# Patient Record
Sex: Female | Born: 1945 | Race: White | Hispanic: No | Smoking: Never smoker
Health system: Southern US, Community
[De-identification: ages and names within clinical notes are randomized; demographics above are authoritative.]

## PROBLEM LIST (undated history)

## (undated) DIAGNOSIS — E785 Hyperlipidemia, unspecified: Secondary | ICD-10-CM

## (undated) DIAGNOSIS — N390 Urinary tract infection, site not specified: Secondary | ICD-10-CM

## (undated) HISTORY — DX: Hyperlipidemia, unspecified: E78.5

---

## 1978-05-10 HISTORY — PX: OTHER SURGICAL HISTORY: SHX169

## 1978-05-10 HISTORY — PX: ABDOMINAL HYSTERECTOMY: SHX81

## 1978-05-10 HISTORY — PX: RECONSTRUCTION URETHROPLASTY: SHX2302

## 2018-02-15 LAB — HM COLONOSCOPY

## 2020-02-20 ENCOUNTER — Other Ambulatory Visit: Payer: Self-pay

## 2020-02-20 ENCOUNTER — Ambulatory Visit (INDEPENDENT_AMBULATORY_CARE_PROVIDER_SITE_OTHER): Payer: Medicare HMO | Admitting: Family Medicine

## 2020-02-20 ENCOUNTER — Encounter: Payer: Self-pay | Admitting: Family Medicine

## 2020-02-20 VITALS — BP 145/59 | HR 57 | Temp 99.1°F | Resp 17 | Ht 62.5 in | Wt 135.8 lb

## 2020-02-20 DIAGNOSIS — Z7689 Persons encountering health services in other specified circumstances: Secondary | ICD-10-CM | POA: Insufficient documentation

## 2020-02-20 DIAGNOSIS — E781 Pure hyperglyceridemia: Secondary | ICD-10-CM | POA: Diagnosis not present

## 2020-02-20 DIAGNOSIS — Z5320 Procedure and treatment not carried out because of patient's decision for unspecified reasons: Secondary | ICD-10-CM | POA: Diagnosis not present

## 2020-02-20 DIAGNOSIS — J302 Other seasonal allergic rhinitis: Secondary | ICD-10-CM | POA: Insufficient documentation

## 2020-02-20 DIAGNOSIS — Z13220 Encounter for screening for lipoid disorders: Secondary | ICD-10-CM

## 2020-02-20 DIAGNOSIS — L989 Disorder of the skin and subcutaneous tissue, unspecified: Secondary | ICD-10-CM | POA: Diagnosis not present

## 2020-02-20 DIAGNOSIS — Z1329 Encounter for screening for other suspected endocrine disorder: Secondary | ICD-10-CM | POA: Diagnosis not present

## 2020-02-20 NOTE — Progress Notes (Signed)
Subjective:    Patient ID: April Russell, female    DOB: [birth year], 74 y.o.   MRN: [id number]  April Russell is a 73 y.o. female presenting on 02/20/2020 for [facility] (recently relocated [place] after her husband passed away. Skin tag on the left side of her cheek x  2 yrs. He  complains of itching . Pt concern because her sister was diagnose with skin cancer.)   HPI  April Russell presents to clinic as new patient for primary care establishment.  Previous PCP was > 2 years ago and is unsure who she had previously met with.  Records will not be requested.  Past medical, family, and surgical history reviewed w/ pt.  She has acute concerns today for skin tag on the left cheek x 2 years.  States has some itching there.  Has some concerns because her sister was recently diagnosed with skin cancer.   Depression screen PHQ 2/9 02/20/2020  Decreased Interest 0  Down, Depressed, Hopeless 3  PHQ - 2 Score 3  Altered sleeping 2  Tired, decreased energy 0  Change in appetite 1  Feeling bad or failure about yourself  0  Trouble concentrating 0  Moving slowly or fidgety/restless 0  Suicidal thoughts 0  PHQ-9 Score 6  Difficult doing work/chores Not difficult at all    Social History   Tobacco Use  . Smoking status: Never Smoker  . Smokeless tobacco: Never Used  Vaping Use  . Vaping Use: Never used  Substance Use Topics  . Alcohol use: Yes    Alcohol/week: 4.0 standard drinks    Types: 4 Glasses of wine per week  . Drug use: Never    Review of Systems  Constitutional: Negative.   HENT: Negative.   Eyes: Negative.   Respiratory: Negative.   Cardiovascular: Negative.   Gastrointestinal: Negative.   Endocrine: Negative.   Genitourinary: Negative.   Musculoskeletal: Negative.   Skin: Negative.        Skin lesion left cheek  Allergic/Immunologic: Negative.   Neurological: Negative.   Hematological: Negative.   Psychiatric/Behavioral: Negative.    Per HPI unless  specifically indicated above     Objective:    BP (!) 145/59 (BP Location: Left Arm, Patient Position: Sitting, Cuff Size: Normal)   Pulse (!) 57   Temp 99.1 F (37.3 C) (Oral)   Resp 17   Ht 5' 2.5" (1.588 m)   Wt 135 lb 12.8 oz (61.6 kg)   SpO2 100%   BMI 24.44 kg/m   Wt Readings from Last 3 Encounters:  02/20/20 135 lb 12.8 oz (61.6 kg)    Physical Exam Vitals and nursing note reviewed.  Constitutional:      General: She is not in acute distress.    Appearance: Normal appearance. She is well-developed and well-groomed. She is not ill-appearing or toxic-appearing.  HENT:     Head: Normocephalic and atraumatic.     Nose:     Comments: Lizbeth Bark is in place, covering mouth and nose. Eyes:     General: Lids are normal. Vision grossly intact.        Right eye: No discharge.        Left eye: No discharge.     Extraocular Movements: Extraocular movements intact.     Conjunctiva/sclera: Conjunctivae normal.     Pupils: Pupils are equal, round, and reactive to light.  Cardiovascular:     Rate and Rhythm: Normal rate and regular rhythm.     Pulses: Normal  pulses.          Dorsalis pedis pulses are 2+ on the right side and 2+ on the left side.     Heart sounds: Normal heart sounds. No murmur heard.  No friction rub. No gallop.   Pulmonary:     Effort: Pulmonary effort is normal. No respiratory distress.     Breath sounds: Normal breath sounds.  Musculoskeletal:     Right lower leg: No edema.     Left lower leg: No edema.  Skin:    General: Skin is warm and dry.     Capillary Refill: Capillary refill takes less than 2 seconds.  Neurological:     General: No focal deficit present.     Mental Status: She is alert and oriented to person, place, and time.  Psychiatric:        Attention and Perception: Attention and perception normal.        Mood and Affect: Mood and affect normal.        Speech: Speech normal.        Behavior: Behavior normal. Behavior is cooperative.         Thought Content: Thought content normal.        Cognition and Memory: Cognition and memory normal.        Judgment: Judgment normal.    No results found for this or any previous visit.    Assessment & Plan:   Problem List Items Addressed This Visit      Musculoskeletal and Integument   Skin lesion    Left periauricular area with small skin lesion, irregular borders, scaling on surface, approx size of tip of pencil eraser, reports is itching/flaking.  Sister had a history of skin cancer.  Has recently relocated to the area, needing to establish with a dermatology.  Plan: 1. Referral to dermatology placed.      Relevant Orders   Ambulatory referral to Dermatology     Other   Encounter to establish care with new doctor - Primary    New patient establishment at [facility] for primary care.  Is unsure who her previous PCP was, reports was > 2 years ago and in [place].  Reports has not been diagnosed with anything or taken any prescription medications in the past.  Will request medical records for review from [facility], reports is where she had her Colonoscopy in 2018.  Plan: 1. Have baseline labs drawn today 2. RTC in 4 weeks for CPE      Relevant Orders   CBC with Differential   COMPLETE METABOLIC PANEL WITH GFR   Seasonal allergies    Reports happens once a year when seasons change, typically takes a benadryl for adequate control of symptoms.      Breast screening declined    Reports last mammogram in the past 2 years, no reports or records to review.  Offered and declined screening mammo order.  Will revisit at next office visit.      Osteoporosis screening declined    Discussed need for DEXA, reports not having one in the past, is postmenopausal.  Offered and declined, will revisit at next follow up visit.       Other Visit Diagnoses    Screening for lipid disorders       Relevant Orders   Lipid Profile   Screening for thyroid disorder       Relevant Orders     TSH + free T4      No  orders of the defined types were placed in this encounter.  Follow up plan: Return in about 3 months (around 05/22/2020) for CPE.   Harlin Rain, [place] Family Nurse Practitioner [facility] 02/20/2020, 11:34 AM

## 2020-02-20 NOTE — Assessment & Plan Note (Signed)
Discussed need for DEXA, reports not having one in the past, is postmenopausal.  Offered and declined, will revisit at next follow up visit.

## 2020-02-20 NOTE — Assessment & Plan Note (Signed)
Left periauricular area with small skin lesion, irregular borders, scaling on surface, approx size of tip of pencil eraser, reports is itching/flaking.  Sister had a history of skin cancer.  Has recently relocated to the area, needing to establish with a dermatology.  Plan: 1. Referral to dermatology placed.

## 2020-02-20 NOTE — Assessment & Plan Note (Signed)
Reports happens once a year when seasons change, typically takes a benadryl for adequate control of symptoms.

## 2020-02-20 NOTE — Assessment & Plan Note (Signed)
New patient establishment at [facility] for primary care.  Is unsure who her previous PCP was, reports was > 2 years ago and in [place].  Reports has not been diagnosed with anything or taken any prescription medications in the past.  Will request medical records for review from [facility], reports is where she had her Colonoscopy in 2018.  Plan: 1. Have baseline labs drawn today 2. RTC in 4 weeks for CPE

## 2020-02-20 NOTE — Patient Instructions (Addendum)
Have your labs drawn and we will contact you with the results.  A referral to Dermatology has been placed today.  If you have not heard from the specialty office or our referral coordinator within 1 week, please let us know and we will follow up with the referral coordinator for an update.  We will plan to see you back in 3 months for your physical  You will receive a survey after today's visit either digitally by e-mail or paper by USPS mail. Your experiences and feedback matter to [place].  Please respond so we know how we are doing as we provide care for you.  Call us with any questions/concerns/needs.  It is my goal to be available to you for your health concerns.  Thanks for choosing me to be a partner in your healthcare needs!  Charlaine Dalton, FNP-C Family Nurse Practitioner [facility] Phone: [phone]

## 2020-02-20 NOTE — Assessment & Plan Note (Signed)
Reports last mammogram in the past 2 years, no reports or records to review.  Offered and declined screening mammo order.  Will revisit at next office visit.

## 2020-02-21 ENCOUNTER — Other Ambulatory Visit: Payer: Self-pay | Admitting: Family Medicine

## 2020-02-21 LAB — COMPLETE METABOLIC PANEL WITH GFR
AG Ratio: 1.7 (calc) (ref 1.0–2.5)
ALT: 13 U/L (ref 6–29)
AST: 21 U/L (ref 10–35)
Albumin: 4.3 g/dL (ref 3.6–5.1)
Alkaline phosphatase (APISO): 61 U/L (ref 37–153)
BUN: 9 mg/dL (ref 7–25)
CO2: 27 mmol/L (ref 20–32)
Calcium: 9.1 mg/dL (ref 8.6–10.4)
Chloride: 100 mmol/L (ref 98–110)
Creat: 0.91 mg/dL (ref 0.60–0.93)
GFR, Est African American: 73 mL/min/{1.73_m2} (ref >=60)
GFR, Est Non African American: 63 mL/min/{1.73_m2} (ref >=60)
Globulin: 2.6 g/dL (calc) (ref 1.9–3.7)
Glucose, Bld: 88 mg/dL (ref 65–99)
Potassium: 4.3 mmol/L (ref 3.5–5.3)
Sodium: 134 mmol/L — ABNORMAL LOW (ref 135–146)
Total Bilirubin: 0.5 mg/dL (ref 0.2–1.2)
Total Protein: 6.9 g/dL (ref 6.1–8.1)

## 2020-02-21 LAB — CBC WITH DIFFERENTIAL/PLATELET
Absolute Monocytes: 498 cells/uL (ref 200–950)
Basophils Absolute: 19 cells/uL (ref 0–200)
Basophils Relative: 0.4 %
Eosinophils Absolute: 38 cells/uL (ref 15–500)
Eosinophils Relative: 0.8 %
HCT: 40.3 % (ref 35.0–45.0)
Hemoglobin: 13.1 g/dL (ref 11.7–15.5)
Lymphs Abs: 1471 cells/uL (ref 850–3900)
MCH: 29 pg (ref 27.0–33.0)
MCHC: 32.5 g/dL (ref 32.0–36.0)
MCV: 89.4 fL (ref 80.0–100.0)
MPV: 10.7 fL (ref 7.5–12.5)
Monocytes Relative: 10.6 %
Neutro Abs: 2674 cells/uL (ref 1500–7800)
Neutrophils Relative %: 56.9 %
Platelets: 167 10*3/uL (ref 140–400)
RBC: 4.51 10*6/uL (ref 3.80–5.10)
RDW: 12.3 % (ref 11.0–15.0)
Total Lymphocyte: 31.3 %
WBC: 4.7 10*3/uL (ref 3.8–10.8)

## 2020-02-21 LAB — LIPID PANEL
Cholesterol: 186 mg/dL (ref <200)
HDL: 57 mg/dL (ref >=50)
LDL Cholesterol (Calc): 99 mg/dL (calc)
Non-HDL Cholesterol (Calc): 129 mg/dL (calc) (ref <130)
Total CHOL/HDL Ratio: 3.3 (calc) (ref <5.0)
Triglycerides: 208 mg/dL — ABNORMAL HIGH (ref <150)

## 2020-02-21 LAB — TSH+FREE T4: TSH W/REFLEX TO FT4: 1.63 mIU/L (ref 0.40–4.50)

## 2020-03-20 DIAGNOSIS — L821 Other seborrheic keratosis: Secondary | ICD-10-CM | POA: Diagnosis not present

## 2020-03-20 DIAGNOSIS — L570 Actinic keratosis: Secondary | ICD-10-CM | POA: Diagnosis not present

## 2020-05-21 ENCOUNTER — Encounter: Payer: Self-pay | Admitting: Family Medicine

## 2020-05-21 ENCOUNTER — Other Ambulatory Visit: Payer: Self-pay

## 2020-05-21 ENCOUNTER — Ambulatory Visit (INDEPENDENT_AMBULATORY_CARE_PROVIDER_SITE_OTHER): Payer: Medicare HMO | Admitting: Family Medicine

## 2020-05-21 VITALS — BP 143/62 | HR 70 | Temp 97.1°F | Resp 18 | Ht 62.5 in | Wt 139.4 lb

## 2020-05-21 DIAGNOSIS — Z13820 Encounter for screening for osteoporosis: Secondary | ICD-10-CM | POA: Insufficient documentation

## 2020-05-21 DIAGNOSIS — Z0000 Encounter for general adult medical examination without abnormal findings: Secondary | ICD-10-CM | POA: Diagnosis not present

## 2020-05-21 DIAGNOSIS — Z1231 Encounter for screening mammogram for malignant neoplasm of breast: Secondary | ICD-10-CM | POA: Diagnosis not present

## 2020-05-21 NOTE — Assessment & Plan Note (Signed)
Pt postmenopausal w/out known history of prior DEXA scan.    Plan: 1. Obtain DG bone density.

## 2020-05-21 NOTE — Assessment & Plan Note (Signed)
Pt last mammogram unknown.   Plan: 1. Screening mammogram order placed.  Pt will call to schedule appointment.  Information given.  

## 2020-05-21 NOTE — Patient Instructions (Signed)
For Mammogram screening for breast cancer and DEXA Scan (Bone mineral density) screening for osteoporosis  Call the Imaging Center below anytime to schedule your own appointment now that order has been placed.  [facility] [address] [place], [place] [address] Phone: [phone]  [facility] [address] [place], [place] [address] Phone: [phone]  Well Visit: Care Instructions Overview  Well visits can help you stay healthy. Your provider has checked your overall health and may have suggested ways to take good care of yourself. Your provider also may have recommended tests. At home, you can help prevent illness with healthy eating, regular exercise, and other steps.  Follow-up care is a key part of your treatment and safety. Be sure to make and go to all appointments, and call your provider if you are having problems. It's also a good idea to know your test results and keep a list of the medicines you take.  How can you care for yourself at home?   Get screening tests that you and your doctor decide on. Screening helps find diseases before any symptoms appear.   Eat healthy foods. Choose fruits, vegetables, whole grains, protein, and low-fat dairy foods. Limit fat, especially saturated fat. Reduce salt in your diet.   Limit alcohol. If you are a man, have no more than 2 drinks a day or 14 drinks a week. If you are a woman, have no more than 1 drink a day or 7 drinks a week.   Get at least 30 minutes of physical activity on most days of the week.  We recommend you go no more than 2 days in a row without exercise. Walking is a good choice. You also may want to do other activities, such as running, swimming, cycling, or playing tennis or team sports. Discuss any changes in your exercise program with your provider.   Reach and stay at a healthy weight. This will lower your risk for many problems, such as  obesity, diabetes, heart disease, and high blood pressure.   Do not smoke or allow others to smoke around you. If you need help quitting, talk to your provider about stop-smoking programs and medicines. These can increase your chances of quitting for good.  Can call 1-800-QUIT-NOW (9854207637) for the [facility], assistance with smoking cessation.   Care for your mental health. It is easy to get weighed down by worry and stress. Learn strategies to manage stress, like deep breathing and mindfulness, and stay connected with your family and community. If you find you often feel sad or hopeless, talk with your provider. Treatment can help.   Talk to your provider about whether you have any risk factors for sexually transmitted infections (STIs). You can help prevent STIs if you wait to have sex with a new partner (or partners) until you've each been tested for STIs. It also helps if you use condoms (female or female condoms) and if you limit your sex partners to one person who only has sex with you. Vaccines are available for some STIs, such as HPV (these are age dependent).   If you think you may have a problem with alcohol or drug use, talk to your provider. This includes prescription medicines (such as amphetamines and opioids) and illegal drugs (such as cocaine and methamphetamine). Your provider can help you figure out what type of treatment is best for you.   If you have concerns about domestic violence or intimate  partner violence, there are resources available to you. National Domestic Abuse Hotline 984-378-2045   Protect your skin from too much sun. When you're outdoors from 10 a.m. to 4 p.m., stay in the shade or cover up with clothing and a hat with a wide brim. Wear sunglasses that block UV rays. Even when it's cloudy, put broad-spectrum sunscreen (SPF 30 or higher) on any exposed skin.   See a dentist one or two times a year for checkups and to have your teeth  cleaned.   See an eye doctor once per year for an eye exam.   Wear a seat belt in the car.  When should you call for help?  Watch closely for changes in your health, and be sure to contact your provider if you have any problems or symptoms that concern you.  We will plan to see you back in 12 months for your physical  You will receive a survey after today's visit either digitally by e-mail or paper by USPS mail. Your experiences and feedback matter to [place].  Please respond so we know how we are doing as we provide care for you.  Call us with any questions/concerns/needs.  It is my goal to be available to you for your health concerns.  Thanks for choosing me to be a partner in your healthcare needs!  Charlaine Dalton, FNP-C Family Nurse Practitioner [facility] Phone: [phone]

## 2020-05-21 NOTE — Assessment & Plan Note (Signed)
Annual physical exam without new findings.  Well adult with no acute concerns.  Plan: 1. Obtain health maintenance screenings as above according to age. - Increase physical activity to 30 minutes most days of the week.  - Eat healthy diet high in vegetables and fruits; low in refined carbohydrates. - Screening labs and tests as ordered 2. Return 1 year for annual physical.  

## 2020-05-21 NOTE — Progress Notes (Signed)
Subjective:    Patient ID: April Russell, female    DOB: [birth year], 75 y.o.   MRN: [id number]  April Russell is a 75 y.o. female presenting on 05/21/2020 for Annual Exam   HPI  HEALTH MAINTENANCE:  Weight/BMI: Overweight, BMI 25.09% Exercise: Sedentary  Diet: Regular Seatbelt: Always Mammogram: Due, ordered today DEXA: Due, ordered today Colon cancer screening: Reports within the last 2 years HIV & Hep C Screening: Offered and declined  GC/CT: Offered and declined  Optometry: Regulary Dentistry: Will be scheduling  IMMUNIZATIONS: Tetanus: Believes was within the last 10 years Influenza: Discussed COVID: Discussed  Depression screen PHQ 2/9 02/20/2020  Decreased Interest 0  Down, Depressed, Hopeless 3  PHQ - 2 Score 3  Altered sleeping 2  Tired, decreased energy 0  Change in appetite 1  Feeling bad or failure about yourself  0  Trouble concentrating 0  Moving slowly or fidgety/restless 0  Suicidal thoughts 0  PHQ-9 Score 6  Difficult doing work/chores Not difficult at all    History reviewed. No pertinent past medical history. History reviewed. No pertinent surgical history. Social History   Socioeconomic History  . Marital status: Widowed    Spouse name: Not on file  . Number of children: Not on file  . Years of education: Not on file  . Highest education level: Not on file  Occupational History  . Not on file  Tobacco Use  . Smoking status: Never Smoker  . Smokeless tobacco: Never Used  Vaping Use  . Vaping Use: Never used  Substance and Sexual Activity  . Alcohol use: Yes    Alcohol/week: 4.0 standard drinks    Types: 4 Glasses of wine per week  . Drug use: Never  . Sexual activity: Not on file  Other Topics Concern  . Not on file  Social History Narrative  . Not on file   Social Determinants of Health   Financial Resource Strain: Not on file  Food Insecurity: Not on file  Transportation Needs: Not on file  Physical Activity: Not on file   Stress: Not on file  Social Connections: Not on file  Intimate Partner Violence: Not on file   History reviewed. No pertinent family history. Current Outpatient Medications on File Prior to Visit  Medication Sig  . acetaminophen (TYLENOL) 500 MG tablet Take 500 mg by mouth every 6 (six) hours as needed.  Marland Kitchen BIOTIN 5000 PO Take by mouth.  . Cholecalciferol (VITAMIN D3) 125 MCG (5000 UT) CAPS Take by mouth.  . folic acid (FOLVITE) 1 MG tablet Take 1 mg by mouth daily.  . Multiple Vitamin (MULTIVITAMIN) tablet Take 1 tablet by mouth daily.  . Probiotic Product (PROBIOTIC-10 PO) Take by mouth.   No current facility-administered medications on file prior to visit.    Per HPI unless specifically indicated above     Objective:    BP (!) 143/62 (BP Location: Left Arm, Patient Position: Sitting, Cuff Size: Normal)   Pulse 70   Temp (!) 97.1 F (36.2 C) (Temporal)   Resp 18   Ht 5' 2.5" (1.588 m)   Wt 139 lb 6.4 oz (63.2 kg)   SpO2 100%   BMI 25.09 kg/m   Wt Readings from Last 3 Encounters:  05/21/20 139 lb 6.4 oz (63.2 kg)  02/20/20 135 lb 12.8 oz (61.6 kg)    Physical Exam Vitals and nursing note reviewed.  Constitutional:      General: She is not in acute distress.  Appearance: Normal appearance. She is well-developed, well-groomed and overweight. She is not ill-appearing or toxic-appearing.  HENT:     Head: Normocephalic and atraumatic.     Right Ear: Tympanic membrane, ear canal and external ear normal. There is no impacted cerumen.     Left Ear: Tympanic membrane, ear canal and external ear normal. There is no impacted cerumen.     Nose: Nose normal. No congestion or rhinorrhea.     Mouth/Throat:     Lips: Pink.     Mouth: Mucous membranes are moist.     Pharynx: Oropharynx is clear. Uvula midline. No oropharyngeal exudate or posterior oropharyngeal erythema.  Eyes:     General: Lids are normal. Vision grossly intact. No scleral icterus.       Right eye: No  discharge.        Left eye: No discharge.     Extraocular Movements: Extraocular movements intact.     Conjunctiva/sclera: Conjunctivae normal.     Pupils: Pupils are equal, round, and reactive to light.  Neck:     Thyroid: No thyroid mass or thyromegaly.  Cardiovascular:     Rate and Rhythm: Normal rate and regular rhythm.     Pulses: Normal pulses.          Dorsalis pedis pulses are 2+ on the right side and 2+ on the left side.     Heart sounds: Normal heart sounds. No murmur heard. No friction rub. No gallop.   Pulmonary:     Effort: Pulmonary effort is normal. No respiratory distress.     Breath sounds: Normal breath sounds.  Abdominal:     General: Abdomen is flat. Bowel sounds are normal. There is no distension.     Palpations: Abdomen is soft. There is no hepatomegaly, splenomegaly or mass.     Tenderness: There is no abdominal tenderness. There is no guarding or rebound.     Hernia: No hernia is present.  Musculoskeletal:        General: Normal range of motion.     Cervical back: Normal range of motion and neck supple. No tenderness.     Right lower leg: No edema.     Left lower leg: No edema.     Comments: Normal tone, strength 5/5 BUE & BLE  Feet:     Right foot:     Skin integrity: Skin integrity normal.     Left foot:     Skin integrity: Skin integrity normal.  Lymphadenopathy:     Cervical: No cervical adenopathy.  Skin:    General: Skin is warm and dry.     Capillary Refill: Capillary refill takes less than 2 seconds.  Neurological:     General: No focal deficit present.     Mental Status: She is alert and oriented to person, place, and time.     Cranial Nerves: No cranial nerve deficit.     Sensory: No sensory deficit.     Motor: No weakness.     Coordination: Coordination normal.     Gait: Gait normal.     Deep Tendon Reflexes: Reflexes normal.  Psychiatric:        Attention and Perception: Attention and perception normal.        Mood and Affect: Mood  and affect normal.        Speech: Speech normal.        Behavior: Behavior normal. Behavior is cooperative.        Thought Content: Thought content normal.  Cognition and Memory: Cognition and memory normal.        Judgment: Judgment normal.     Results for orders placed or performed in visit on 02/21/20  HM COLONOSCOPY  Result Value Ref Range   HM Colonoscopy See Report (in chart) See Report (in chart), Patient Reported      Assessment & Plan:   Problem List Items Addressed This Visit      Other   Annual physical exam - Primary    Annual physical exam without new findings.  Well adult with no acute concerns.  Plan: 1. Obtain health maintenance screenings as above according to age. - Increase physical activity to 30 minutes most days of the week.  - Eat healthy diet high in vegetables and fruits; low in refined carbohydrates. - Screening labs and tests as ordered 2. Return 1 year for annual physical.       Encounter for screening mammogram for malignant neoplasm of breast    Pt last mammogram unknown.  Plan: 1. Screening mammogram order placed.  Pt will call to schedule appointment.  Information given.       Relevant Orders   MM 3D SCREEN BREAST BILATERAL   Screening for osteoporosis    Pt postmenopausal w/out known history of prior DEXA scan.    Plan: 1. Obtain DG bone density.         Relevant Orders   DG Bone Density      No orders of the defined types were placed in this encounter.  Follow up plan: Return in about 1 year (around 05/21/2021) for CPE.  Charlaine Dalton, FNP-C Family Nurse Practitioner [facility] 05/21/2020, 10:14 AM

## 2020-06-24 ENCOUNTER — Ambulatory Visit
Admission: RE | Admit: 2020-06-24 | Discharge: 2020-06-24 | Disposition: A | Discharge status: Home / Self Care | Payer: Medicare HMO | Source: Ambulatory Visit | Attending: Family Medicine | Admitting: Family Medicine

## 2020-06-24 ENCOUNTER — Encounter: Payer: Self-pay | Admitting: Family Medicine

## 2020-06-24 ENCOUNTER — Other Ambulatory Visit: Payer: Self-pay

## 2020-06-24 DIAGNOSIS — Z780 Asymptomatic menopausal state: Secondary | ICD-10-CM | POA: Insufficient documentation

## 2020-06-24 DIAGNOSIS — M8589 Other specified disorders of bone density and structure, multiple sites: Secondary | ICD-10-CM | POA: Diagnosis not present

## 2020-06-24 DIAGNOSIS — Z1231 Encounter for screening mammogram for malignant neoplasm of breast: Secondary | ICD-10-CM | POA: Insufficient documentation

## 2020-06-24 DIAGNOSIS — Z13820 Encounter for screening for osteoporosis: Secondary | ICD-10-CM | POA: Insufficient documentation

## 2020-06-24 DIAGNOSIS — M85851 Other specified disorders of bone density and structure, right thigh: Secondary | ICD-10-CM | POA: Insufficient documentation

## 2021-01-13 DIAGNOSIS — Z0100 Encounter for examination of eyes and vision without abnormal findings: Secondary | ICD-10-CM | POA: Diagnosis not present

## 2021-01-13 DIAGNOSIS — H5203 Hypermetropia, bilateral: Secondary | ICD-10-CM | POA: Diagnosis not present

## 2021-01-22 ENCOUNTER — Other Ambulatory Visit: Payer: Self-pay | Admitting: Internal Medicine

## 2021-01-23 ENCOUNTER — Other Ambulatory Visit: Payer: Self-pay

## 2021-01-23 ENCOUNTER — Ambulatory Visit (INDEPENDENT_AMBULATORY_CARE_PROVIDER_SITE_OTHER): Payer: Medicare HMO | Admitting: Internal Medicine

## 2021-01-23 ENCOUNTER — Encounter: Payer: Self-pay | Admitting: Internal Medicine

## 2021-01-23 VITALS — BP 140/100 | HR 59 | Temp 98.2°F | Ht 62.5 in | Wt 139.0 lb

## 2021-01-23 DIAGNOSIS — R030 Elevated blood-pressure reading, without diagnosis of hypertension: Secondary | ICD-10-CM

## 2021-01-23 DIAGNOSIS — I83813 Varicose veins of bilateral lower extremities with pain: Secondary | ICD-10-CM | POA: Insufficient documentation

## 2021-01-23 DIAGNOSIS — M8580 Other specified disorders of bone density and structure, unspecified site: Secondary | ICD-10-CM | POA: Diagnosis not present

## 2021-01-23 NOTE — Patient Instructions (Signed)
Monitor BP at home once a week. Record the numbers. If over 140/90 - then may need medication so call for advice.

## 2021-01-23 NOTE — Progress Notes (Signed)
Date:  01/23/2021   Name:  April Russell   DOB:  July 17, 1945   MRN:  [id number]   Chief Complaint: Establish Care and Leg Pain (Both legs,Going on for years getting worse, Hurts more at night)  Leg Pain  There was no injury mechanism. The pain is present in the right thigh, right leg and left leg. The pain is moderate. The pain has been Fluctuating (mostly in the evening and at night) since onset. She has tried NSAIDs for the symptoms. The treatment provided moderate relief.   Lab Results  Component Value Date   CREATININE 0.91 02/20/2020   BUN 9 02/20/2020   NA 134 (L) 02/20/2020   K 4.3 02/20/2020   CL 100 02/20/2020   CO2 27 02/20/2020   Lab Results  Component Value Date   CHOL 186 02/20/2020   HDL 57 02/20/2020   LDLCALC 99 02/20/2020   TRIG 208 (H) 02/20/2020   CHOLHDL 3.3 02/20/2020   No results found for: TSH No results found for: HGBA1C Lab Results  Component Value Date   WBC 4.7 02/20/2020   HGB 13.1 02/20/2020   HCT 40.3 02/20/2020   MCV 89.4 02/20/2020   PLT 167 02/20/2020   Lab Results  Component Value Date   ALT 13 02/20/2020   AST 21 02/20/2020   BILITOT 0.5 02/20/2020     Review of Systems  Constitutional:  Negative for chills, fatigue and unexpected weight change.  Respiratory:  Negative for chest tightness, shortness of breath and wheezing.   Cardiovascular:  Negative for chest pain and palpitations.  Gastrointestinal:  Negative for abdominal pain, constipation and diarrhea.       Occasional gerd  Musculoskeletal:  Positive for myalgias (leg pain). Negative for arthralgias.  Allergic/Immunologic: Positive for environmental allergies.  Neurological:  Positive for dizziness. Negative for headaches.  Psychiatric/Behavioral:  Negative for dysphoric mood and sleep disturbance. The patient is not nervous/anxious.    Patient Active Problem List   Diagnosis Date Noted   Osteopenia of neck of right femur 06/24/2020   Annual physical exam 05/21/2020    Encounter for screening mammogram for malignant neoplasm of breast 05/21/2020   Screening for osteoporosis 05/21/2020   Encounter to establish care with new doctor 02/20/2020   Seasonal allergies 02/20/2020   Skin lesion 02/20/2020   Breast screening declined 02/20/2020   Osteoporosis screening declined 02/20/2020    Allergies  Allergen Reactions   Sulfa Antibiotics Hives    Past Surgical History:  Procedure Laterality Date   ABDOMINAL HYSTERECTOMY  1980   colon reconstruction  1980   RECONSTRUCTION URETHROPLASTY  1980    Social History   Tobacco Use   Smoking status: Never   Smokeless tobacco: Never  Vaping Use   Vaping Use: Never used  Substance Use Topics   Alcohol use: Yes    Alcohol/week: 4.0 standard drinks    Types: 4 Glasses of wine per week    Comment: occass   Drug use: Never     Medication list has been reviewed and updated.  Current Meds  Medication Sig   acetaminophen (TYLENOL) 500 MG tablet Take 500 mg by mouth every 6 (six) hours as needed.   BIOTIN 5000 PO Take by mouth.   Cholecalciferol (VITAMIN D3) 125 MCG (5000 UT) CAPS Take by mouth.   Cyanocobalamin (VITAMIN B-12 PO) Take by mouth daily.   diphenhydrAMINE HCl (BENADRYL PO) Take by mouth daily.   folic acid (FOLVITE) 1 MG tablet Take 1 mg  by mouth daily.   Multiple Vitamin (MULTIVITAMIN) tablet Take 1 tablet by mouth daily.   Probiotic Product (PROBIOTIC-10 PO) Take by mouth as needed.   [DISCONTINUED] azelastine (ASTELIN) 0.1 % nasal spray Place into the nose.    PHQ 2/9 Scores 01/23/2021 02/20/2020  PHQ - 2 Score 2 3  PHQ- 9 Score 8 6    GAD 7 : Generalized Anxiety Score 01/23/2021  Nervous, Anxious, on Edge 1  Control/stop worrying 2  Worry too much - different things 2  Trouble relaxing 1  Restless 1  Easily annoyed or irritable 0  Afraid - awful might happen 0  Total GAD 7 Score 7    BP Readings from Last 3 Encounters:  01/23/21 (!) 140/100  05/21/20 (!) 143/62   02/20/20 (!) 145/59    Physical Exam Vitals and nursing note reviewed.  Constitutional:      General: She is not in acute distress.    Appearance: Normal appearance. She is well-developed.  HENT:     Head: Normocephalic and atraumatic.  Cardiovascular:     Rate and Rhythm: Normal rate and regular rhythm.     Pulses: Normal pulses.     Heart sounds: No murmur heard. Pulmonary:     Effort: Pulmonary effort is normal. No respiratory distress.     Breath sounds: No wheezing or rhonchi.  Musculoskeletal:        General: Swelling (varicose veins right thigh and both legs) present.     Cervical back: Normal range of motion.  Lymphadenopathy:     Cervical: No cervical adenopathy.  Skin:    General: Skin is warm and dry.     Capillary Refill: Capillary refill takes less than 2 seconds.     Findings: No rash.  Neurological:     Mental Status: She is alert and oriented to person, place, and time.  Psychiatric:        Mood and Affect: Mood normal.        Behavior: Behavior normal.    Wt Readings from Last 3 Encounters:  01/23/21 139 lb (63 kg)  05/21/20 139 lb 6.4 oz (63.2 kg)  02/20/20 135 lb 12.8 oz (61.6 kg)    BP (!) 140/100   Pulse (!) 59   Temp 98.2 F (36.8 C) (Oral)   Ht 5' 2.5" (1.588 m)   Wt 139 lb (63 kg)   SpO2 98%   BMI 25.02 kg/m   Assessment and Plan: 1. Varicose veins of both lower extremities with pain Recommend compression stockings during the day Ibuprofen every evening  Elevated legs during the day if possible  2. Osteopenia determined by x-ray Continue Calcium and vitamin D Weight bearing exercise - currently doing home exercise through silver sneakers  3. Elevated blood pressure reading Suspect anxiety -pt will begin checking at home and will return if > 140/90   Partially dictated using Dragon software. Any errors are unintentional.  Bari Edward, MD [facility]  01/23/2021

## 2021-03-19 ENCOUNTER — Telehealth: Payer: Self-pay | Admitting: Internal Medicine

## 2021-03-19 NOTE — Telephone Encounter (Signed)
Copied from CRM [id number]. Topic: Medicare AWV >> Mar 19, 2021 11:14 AM Claudette Laws R wrote: Reason for CRM:  Left message for patient to call back and schedule Medicare Annual Wellness Visit (AWV) in office.   If unable to come into the office for AWV,  please offer to do virtually or by telephone.  No hx of AWV eligible for AWVI as of 11/07/2012 per [employer]  Please schedule at anytime with [facility] Health Advisor.      40 Minutes appointment   Any questions, please call me at [phone]

## 2021-05-29 ENCOUNTER — Encounter: Payer: Medicare HMO | Admitting: Internal Medicine

## 2021-06-18 ENCOUNTER — Other Ambulatory Visit: Payer: Self-pay

## 2021-06-18 ENCOUNTER — Encounter: Payer: Self-pay | Admitting: Internal Medicine

## 2021-06-18 ENCOUNTER — Ambulatory Visit (INDEPENDENT_AMBULATORY_CARE_PROVIDER_SITE_OTHER): Payer: Medicare HMO | Admitting: Internal Medicine

## 2021-06-18 VITALS — BP 128/76 | HR 64 | Ht 62.5 in | Wt 138.2 lb

## 2021-06-18 DIAGNOSIS — E781 Pure hyperglyceridemia: Secondary | ICD-10-CM

## 2021-06-18 DIAGNOSIS — Z0000 Encounter for general adult medical examination without abnormal findings: Secondary | ICD-10-CM

## 2021-06-18 DIAGNOSIS — Z1159 Encounter for screening for other viral diseases: Secondary | ICD-10-CM

## 2021-06-18 DIAGNOSIS — R82998 Other abnormal findings in urine: Secondary | ICD-10-CM

## 2021-06-18 DIAGNOSIS — R030 Elevated blood-pressure reading, without diagnosis of hypertension: Secondary | ICD-10-CM

## 2021-06-18 DIAGNOSIS — Z1231 Encounter for screening mammogram for malignant neoplasm of breast: Secondary | ICD-10-CM

## 2021-06-18 LAB — POCT URINALYSIS DIPSTICK
Bilirubin, UA: NEGATIVE
Glucose, UA: NEGATIVE
Ketones, UA: NEGATIVE
Nitrite, UA: NEGATIVE
Protein, UA: NEGATIVE
Spec Grav, UA: <=1.005 — AB (ref 1.010–1.025)
Urobilinogen, UA: 0.2 E.U./dL
pH, UA: 5 (ref 5.0–8.0)

## 2021-06-18 LAB — POCT UA - MICROSCOPIC ONLY
Bacteria, U Microscopic: 0
Casts, Ur, LPF, POC: 0
Crystals, Ur, HPF, POC: 0
Mucus, UA: 0
RBC, Urine, Miroscopic: 0 (ref 0–2)
WBC, Ur, HPF, POC: 2 (ref 0–5)
Yeast, UA: 0

## 2021-06-18 NOTE — Progress Notes (Addendum)
Date:  06/18/2021   Name:  April Russell   DOB:  1946-03-31   MRN:  [id number]   Chief Complaint: Annual Exam (Breast Exam.) April Russell is a 76 y.o. female who presents today for her Complete Annual Exam. She feels well. She reports exercising - silver sneakers. She reports she is sleeping poorly. Husband passed away 2 years ago and still does not sleep well since then. Breast complaints - none.  Mammogram: 06/2020 DEXA: 06/2020 Pap smear: discontinued Colonoscopy: 02/2018   There is no immunization history on file for this patient.  HPI  Lab Results  Component Value Date   NA 134 (L) 02/20/2020   K 4.3 02/20/2020   CO2 27 02/20/2020   GLUCOSE 88 02/20/2020   BUN 9 02/20/2020   CREATININE 0.91 02/20/2020   CALCIUM 9.1 02/20/2020   GFRNONAA 63 02/20/2020   Lab Results  Component Value Date   CHOL 186 02/20/2020   HDL 57 02/20/2020   LDLCALC 99 02/20/2020   TRIG 208 (H) 02/20/2020   CHOLHDL 3.3 02/20/2020   No results found for: TSH No results found for: HGBA1C Lab Results  Component Value Date   WBC 4.7 02/20/2020   HGB 13.1 02/20/2020   HCT 40.3 02/20/2020   MCV 89.4 02/20/2020   PLT 167 02/20/2020   Lab Results  Component Value Date   ALT 13 02/20/2020   AST 21 02/20/2020   BILITOT 0.5 02/20/2020   No results found for: 25OHVITD2, 25OHVITD3, VD25OH   Review of Systems  Constitutional:  Negative for chills, fatigue and fever.  HENT:  Positive for hearing loss and tinnitus. Negative for congestion, trouble swallowing and voice change.   Eyes:  Negative for visual disturbance.  Respiratory:  Negative for cough, chest tightness, shortness of breath and wheezing.   Cardiovascular:  Negative for chest pain, palpitations and leg swelling.  Gastrointestinal:  Negative for abdominal pain, constipation, diarrhea and vomiting.  Endocrine: Negative for polydipsia and polyuria.  Genitourinary:  Negative for dysuria, frequency, genital sores, vaginal bleeding and  vaginal discharge.  Musculoskeletal:  Negative for arthralgias, gait problem and joint swelling.  Skin:  Negative for color change and rash.  Neurological:  Negative for dizziness, tremors, light-headedness and headaches.  Hematological:  Negative for adenopathy. Does not bruise/bleed easily.  Psychiatric/Behavioral:  Positive for sleep disturbance. Negative for dysphoric mood. The patient is not nervous/anxious.    Patient Active Problem List   Diagnosis Date Noted   Varicose veins of both lower extremities with pain 01/23/2021   Osteopenia determined by x-ray 01/23/2021   Elevated blood pressure reading 01/23/2021   Seasonal allergies 02/20/2020   Skin lesion 02/20/2020    Allergies  Allergen Reactions   Sulfa Antibiotics Hives    Past Surgical History:  Procedure Laterality Date   ABDOMINAL HYSTERECTOMY  1980   colon reconstruction  1980   RECONSTRUCTION URETHROPLASTY  1980    Social History   Tobacco Use   Smoking status: Never   Smokeless tobacco: Never  Vaping Use   Vaping Use: Never used  Substance Use Topics   Alcohol use: Yes    Alcohol/week: 4.0 standard drinks    Types: 4 Glasses of wine per week    Comment: occass   Drug use: Never     Medication list has been reviewed and updated.  Current Meds  Medication Sig   acetaminophen (TYLENOL) 500 MG tablet Take 500 mg by mouth every 6 (six) hours as needed.   BIOTIN  5000 PO Take by mouth.   Cholecalciferol (VITAMIN D3) 125 MCG (5000 UT) CAPS Take by mouth.   Cyanocobalamin (VITAMIN B-12 PO) Take by mouth daily.   diphenhydrAMINE HCl (BENADRYL PO) Take by mouth daily.   folic acid (FOLVITE) 1 MG tablet Take 1 mg by mouth daily.   ibuprofen (ADVIL) 200 MG tablet Take 200 mg by mouth every 6 (six) hours as needed. For aching Hands and ankles.   Multiple Vitamin (MULTIVITAMIN) tablet Take 1 tablet by mouth daily.   omeprazole (PRILOSEC) 10 MG capsule Take 10 mg by mouth daily.   Probiotic Product  (PROBIOTIC-10 PO) Take by mouth as needed.    PHQ 2/9 Scores 06/18/2021 01/23/2021 02/20/2020  PHQ - 2 Score 1 2 3   PHQ- 9 Score 4 8 6     GAD 7 : Generalized Anxiety Score 06/18/2021 01/23/2021  Nervous, Anxious, on Edge 1 1  Control/stop worrying 1 2  Worry too much - different things 2 2  Trouble relaxing 1 1  Restless 1 1  Easily annoyed or irritable 0 0  Afraid - awful might happen 0 0  Total GAD 7 Score 6 7  Anxiety Difficulty Somewhat difficult -    BP Readings from Last 3 Encounters:  06/18/21 128/76  01/23/21 (!) 140/100  05/21/20 (!) 143/62    Physical Exam Vitals and nursing note reviewed.  Constitutional:      General: She is not in acute distress.    Appearance: Normal appearance. She is well-developed.  HENT:     Head: Normocephalic and atraumatic.     Right Ear: Tympanic membrane and ear canal normal.     Left Ear: Tympanic membrane and ear canal normal.     Nose:     Right Sinus: No maxillary sinus tenderness.     Left Sinus: No maxillary sinus tenderness.  Eyes:     General: No scleral icterus.       Right eye: No discharge.        Left eye: No discharge.     Conjunctiva/sclera: Conjunctivae normal.  Neck:     Thyroid: No thyromegaly.     Vascular: No carotid bruit.  Cardiovascular:     Rate and Rhythm: Normal rate and regular rhythm.     Pulses: Normal pulses.     Heart sounds: Normal heart sounds.  Pulmonary:     Effort: Pulmonary effort is normal. No respiratory distress.     Breath sounds: No wheezing or rhonchi.  Chest:  Breasts:    Right: No mass, nipple discharge, skin change or tenderness.     Left: No mass, nipple discharge, skin change or tenderness.  Abdominal:     General: Bowel sounds are normal.     Palpations: Abdomen is soft.     Tenderness: There is no abdominal tenderness.  Musculoskeletal:        General: Normal range of motion.     Cervical back: Normal range of motion. No erythema.     Right lower leg: No edema.     Left  lower leg: No edema.  Lymphadenopathy:     Cervical: No cervical adenopathy.  Skin:    General: Skin is warm and dry.     Capillary Refill: Capillary refill takes less than 2 seconds.     Findings: No rash.  Neurological:     General: No focal deficit present.     Mental Status: She is alert and oriented to person, place, and time.  Cranial Nerves: No cranial nerve deficit.     Sensory: No sensory deficit.     Deep Tendon Reflexes: Reflexes are normal and symmetric.  Psychiatric:        Attention and Perception: Attention normal.        Mood and Affect: Mood normal.        Behavior: Behavior normal.    Wt Readings from Last 3 Encounters:  06/18/21 138 lb 3.2 oz (62.7 kg)  01/23/21 139 lb (63 kg)  05/21/20 139 lb 6.4 oz (63.2 kg)    BP 128/76    Pulse 64    Ht 5' 2.5" (1.588 m)    Wt 138 lb 3.2 oz (62.7 kg)    SpO2 96%    BMI 24.87 kg/m   Assessment and Plan: 1. Annual physical exam Normal exam. She declines all vaccinations. We discussed Pneumonia vaccine and she will consider it. - POCT urinalysis dipstick  2. Encounter for screening mammogram for breast cancer Schedule this month at [facility]. - MM 3D SCREEN BREAST BILATERAL  3. Need for hepatitis C screening test - Hepatitis C antibody  4. Hypertriglyceridemia Continue low fat diet. - Lipid panel  5. Elevated blood pressure reading Normal today.  Recent readings at home are improved since limiting sodium and fried foods. - CBC with Differential/Platelet - Comprehensive metabolic panel - TSH   Partially dictated using Editor, commissioning. Any errors are unintentional.  Halina Maidens, MD [facility]  06/18/2021

## 2021-06-18 NOTE — Patient Instructions (Signed)
Consider getting the Pneumonia vaccine called Prevnar-20.

## 2021-06-19 ENCOUNTER — Other Ambulatory Visit: Payer: Self-pay | Admitting: Internal Medicine

## 2021-06-19 ENCOUNTER — Encounter: Payer: Self-pay | Admitting: Internal Medicine

## 2021-06-19 DIAGNOSIS — E782 Mixed hyperlipidemia: Secondary | ICD-10-CM | POA: Insufficient documentation

## 2021-06-19 DIAGNOSIS — E785 Hyperlipidemia, unspecified: Secondary | ICD-10-CM

## 2021-06-19 LAB — COMPREHENSIVE METABOLIC PANEL
ALT: 12 IU/L (ref 0–32)
AST: 23 IU/L (ref 0–40)
Albumin/Globulin Ratio: 1.9 (ref 1.2–2.2)
Albumin: 4.4 g/dL (ref 3.7–4.7)
Alkaline Phosphatase: 66 IU/L (ref 44–121)
BUN/Creatinine Ratio: 10 — ABNORMAL LOW (ref 12–28)
BUN: 10 mg/dL (ref 8–27)
Bilirubin Total: 0.5 mg/dL (ref 0.0–1.2)
CO2: 22 mmol/L (ref 20–29)
Calcium: 9.2 mg/dL (ref 8.7–10.3)
Chloride: 103 mmol/L (ref 96–106)
Creatinine, Ser: 1 mg/dL (ref 0.57–1.00)
Globulin, Total: 2.3 g/dL (ref 1.5–4.5)
Glucose: 89 mg/dL (ref 70–99)
Potassium: 4.5 mmol/L (ref 3.5–5.2)
Sodium: 139 mmol/L (ref 134–144)
Total Protein: 6.7 g/dL (ref 6.0–8.5)
eGFR: 59 mL/min/{1.73_m2} — ABNORMAL LOW (ref >59)

## 2021-06-19 LAB — CBC WITH DIFFERENTIAL/PLATELET
Basophils Absolute: 0 10*3/uL (ref 0.0–0.2)
Basos: 0 %
EOS (ABSOLUTE): 0 10*3/uL (ref 0.0–0.4)
Eos: 1 %
Hematocrit: 40.3 % (ref 34.0–46.6)
Hemoglobin: 13.1 g/dL (ref 11.1–15.9)
Immature Grans (Abs): 0 10*3/uL (ref 0.0–0.1)
Immature Granulocytes: 0 %
Lymphocytes Absolute: 1.4 10*3/uL (ref 0.7–3.1)
Lymphs: 29 %
MCH: 28.6 pg (ref 26.6–33.0)
MCHC: 32.5 g/dL (ref 31.5–35.7)
MCV: 88 fL (ref 79–97)
Monocytes Absolute: 0.4 10*3/uL (ref 0.1–0.9)
Monocytes: 8 %
Neutrophils Absolute: 3 10*3/uL (ref 1.4–7.0)
Neutrophils: 62 %
Platelets: 194 10*3/uL (ref 150–450)
RBC: 4.58 x10E6/uL (ref 3.77–5.28)
RDW: 12.4 % (ref 11.7–15.4)
WBC: 4.9 10*3/uL (ref 3.4–10.8)

## 2021-06-19 LAB — LIPID PANEL
Chol/HDL Ratio: 3.7 ratio (ref 0.0–4.4)
Cholesterol, Total: 194 mg/dL (ref 100–199)
HDL: 52 mg/dL (ref >39)
LDL Chol Calc (NIH): 115 mg/dL — ABNORMAL HIGH (ref 0–99)
Triglycerides: 152 mg/dL — ABNORMAL HIGH (ref 0–149)
VLDL Cholesterol Cal: 27 mg/dL (ref 5–40)

## 2021-06-19 LAB — TSH: TSH: 2.11 u[IU]/mL (ref 0.450–4.500)

## 2021-06-19 LAB — HEPATITIS C ANTIBODY: Hep C Virus Ab: <0.1 s/co ratio (ref 0.0–0.9)

## 2021-06-19 MED ORDER — ATORVASTATIN CALCIUM 10 MG PO TABS: 10.0000 mg | ORAL_TABLET | Freq: Every day | ORAL | 1 refills | Status: DC

## 2021-06-19 NOTE — Progress Notes (Signed)
Spoke to pt with labs. She would like to start medication for cholesterol. Pt wants it sent to [place]. Pt verbalized understanding.  KP

## 2021-06-25 ENCOUNTER — Telehealth: Payer: Self-pay

## 2021-06-25 NOTE — Telephone Encounter (Signed)
Called pt as a reminder to call and schedule mammogram. Pt verbalized understanding.  KP 

## 2021-06-29 ENCOUNTER — Ambulatory Visit (INDEPENDENT_AMBULATORY_CARE_PROVIDER_SITE_OTHER): Payer: Medicare HMO

## 2021-06-29 DIAGNOSIS — Z0000 Encounter for general adult medical examination without abnormal findings: Secondary | ICD-10-CM | POA: Diagnosis not present

## 2021-06-29 NOTE — Progress Notes (Signed)
Subjective:   April MurphyBarbara Russell is a 76 y.o. female who presents for an Initial Medicare Annual Wellness Visit.  Virtual Visit via Telephone Note  I connected with  April MurphyBarbara Russell on 06/29/21 at  1:20 PM EST by telephone and verified that I am speaking with the correct person using two identifiers.  Location: Patient: home Provider[facility] Persons participating in the virtual visit: patient/Nurse Health Advisor   I discussed the limitations, risks, security and privacy concerns of performing an evaluation and management service by telephone and the availability of in person appointments. The patient expressed understanding and agreed to proceed.  Interactive audio and video telecommunications were attempted between this nurse and patient, however failed, due to patient having technical difficulties OR patient did not have access to video capability.  We continued and completed visit with audio only.  Some vital signs may be absent or patient reported.   Reather LittlerKasey Jareth Pardee, LPN   Review of Systems     Cardiac Risk Factors include: advanced age (>11055men, 49>65 women);dyslipidemia     Objective:    There were no vitals filed for this visit. There is no height or weight on file to calculate BMI.  Advanced Directives 06/29/2021  Does Patient Have a Medical Advance Directive? No  Would patient like information on creating a medical advance directive? Yes (MAU/Ambulatory/Procedural Areas - Information given)    Current Medications (verified) Outpatient Encounter Medications as of 06/29/2021  Medication Sig   acetaminophen (TYLENOL) 500 MG tablet Take 500 mg by mouth every 6 (six) hours as needed.   atorvastatin (LIPITOR) 10 MG tablet Take 1 tablet (10 mg total) by mouth daily.   BIOTIN 5000 PO Take by mouth.   Cholecalciferol (VITAMIN D3) 125 MCG (5000 UT) CAPS Take by mouth.   Cyanocobalamin (VITAMIN B-12 PO) Take by mouth daily.   diphenhydrAMINE HCl (BENADRYL PO) Take by mouth daily.   folic  acid (FOLVITE) 1 MG tablet Take 1 mg by mouth daily.   ibuprofen (ADVIL) 200 MG tablet Take 200 mg by mouth every 6 (six) hours as needed. For aching Hands and ankles.   Multiple Vitamin (MULTIVITAMIN) tablet Take 1 tablet by mouth daily.   omeprazole (PRILOSEC) 10 MG capsule Take 20 mg by mouth daily.   Probiotic Product (PROBIOTIC-10 PO) Take by mouth as needed.   No facility-administered encounter medications on file as of 06/29/2021.    Allergies (verified) Sulfa antibiotics   History: Past Medical History:  Diagnosis Date   Hyperlipidemia    Past Surgical History:  Procedure Laterality Date   ABDOMINAL HYSTERECTOMY  1980   colon reconstruction  1980   RECONSTRUCTION URETHROPLASTY  1980   Family History  Problem Relation Age of Onset   Hypertension Mother    Cancer Father    Social History   Socioeconomic History   Marital status: Widowed    Spouse name: Not on file   Number of children: 4   Years of education: Not on file   Highest education level: Not on file  Occupational History   Not on file  Tobacco Use   Smoking status: Never   Smokeless tobacco: Never  Vaping Use   Vaping Use: Never used  Substance and Sexual Activity   Alcohol use: Yes    Alcohol/week: 4.0 standard drinks    Types: 4 Glasses of wine per week    Comment: occass   Drug use: Never   Sexual activity: Not Currently  Other Topics Concern   Not on  file  Social History Narrative   Pt lives with her son who is legally blind   Social Determinants of Corporate investment banker Strain: Low Risk    Difficulty of Paying Living Expenses: Not hard at all  Food Insecurity: No Food Insecurity   Worried About Programme researcher, broadcasting/film/video in the Last Year: Never true   Barista in the Last Year: Never true  Transportation Needs: No Transportation Needs   Lack of Transportation (Medical): No   Lack of Transportation (Non-Medical): No  Physical Activity: Sufficiently Active   Days of Exercise  per Week: 3 days   Minutes of Exercise per Session: 60 min  Stress: No Stress Concern Present   Feeling of Stress : Only a little  Social Connections: Socially Isolated   Frequency of Communication with Friends and Family: More than three times a week   Frequency of Social Gatherings with Friends and Family: Never   Attends Religious Services: Never   Database administrator or Organizations: No   Attends Banker Meetings: Never   Marital Status: Widowed    Tobacco Counseling Counseling given: Not Answered   Clinical Intake:  Pre-visit preparation completed: Yes  Pain : No/denies pain     Nutritional Risks: None Diabetes: No  How often do you need to have someone help you when you read instructions, pamphlets, or other written materials from your doctor or pharmacy?: 1 - Never    Interpreter Needed?: No  Information entered by :: Reather Littler LPN   Activities of Daily Living In your present state of health, do you have any difficulty performing the following activities: 06/29/2021 01/23/2021  Hearing? N Y  Vision? N N  Difficulty concentrating or making decisions? N N  Walking or climbing stairs? N N  Dressing or bathing? N N  Doing errands, shopping? N N  Preparing Food and eating ? N -  Using the Toilet? N -  In the past six months, have you accidently leaked urine? N -  Do you have problems with loss of bowel control? N -  Managing your Medications? N -  Managing your Finances? N -  Housekeeping or managing your Housekeeping? N -  Some recent data might be hidden    Patient Care Team: Reubin Milan, MD as PCP - General (Internal Medicine)  Indicate any recent Medical Services you may have received from other than [facility] providers in the past year (date may be approximate).     Assessment:   This is a routine wellness examination for [place].  Hearing/Vision screen Hearing Screening - Comments:: Pt denies hearing difficulty Vision  Screening - Comments:: Annual vision screenings done by Dr. Milinda Cave at [facility]  Dietary issues and exercise activities discussed: Current Exercise Habits: Structured exercise class, Type of exercise: calisthenics;stretching;strength training/weights, Time (Minutes): 50, Frequency (Times/Week): 3, Weekly Exercise (Minutes/Week): 150, Intensity: Moderate, Exercise limited by: None identified   Goals Addressed             This Visit's Progress    Patient Stated       Patient states she would like to lower her cholesterol with healthy eating and physical activity        Depression Screen PHQ 2/9 Scores 06/29/2021 06/18/2021 01/23/2021 02/20/2020  PHQ - 2 Score 1 1 2 3   PHQ- 9 Score 3 4 8 6     Fall Risk Fall Risk  06/29/2021 06/18/2021 01/23/2021 02/20/2020  Falls in the past year? 0  0 0 0  Number falls in past yr: 0 0 0 0  Injury with Fall? 0 0 0 -  Risk for fall due to : No Fall Risks No Fall Risks No Fall Risks No Fall Risks  Follow up Falls prevention discussed Falls evaluation completed Falls evaluation completed Falls evaluation completed    FALL RISK PREVENTION PERTAINING TO THE HOME:  Any stairs in or around the home? Yes  If so, are there any without handrails? No  Home free of loose throw rugs in walkways, pet beds, electrical cords, etc? Yes  Adequate lighting in your home to reduce risk of falls? Yes   ASSISTIVE DEVICES UTILIZED TO PREVENT FALLS:  Life alert? No  Use of a cane, walker or w/c? No  Grab bars in the bathroom? Yes  Shower chair or bench in shower? Yes  Elevated toilet seat or a handicapped toilet? No   TIMED UP AND GO:  Was the test performed? No . Telephonic visit  Cognitive Function: Normal cognitive status assessed by direct observation by this Nurse Health Advisor. No abnormalities found.          Immunizations  There is no immunization history on file for this patient.  TDAP status: Due, Education has been provided regarding the importance  of this vaccine. Advised may receive this vaccine at local pharmacy or Health Dept. Aware to provide a copy of the vaccination record if obtained from local pharmacy or Health Dept. Verbalized acceptance and understanding.  Flu Vaccine status: Declined, Education has been provided regarding the importance of this vaccine but patient still declined. Advised may receive this vaccine at local pharmacy or Health Dept. Aware to provide a copy of the vaccination record if obtained from local pharmacy or Health Dept. Verbalized acceptance and understanding.  Pneumococcal vaccine status: Declined,  Education has been provided regarding the importance of this vaccine but patient still declined. Advised may receive this vaccine at local pharmacy or Health Dept. Aware to provide a copy of the vaccination record if obtained from local pharmacy or Health Dept. Verbalized acceptance and understanding.   Covid-19 vaccine status: Declined, Education has been provided regarding the importance of this vaccine but patient still declined. Advised may receive this vaccine at local pharmacy or Health Dept.or vaccine clinic. Aware to provide a copy of the vaccination record if obtained from local pharmacy or Health Dept. Verbalized acceptance and understanding.  Qualifies for Shingles Vaccine? Yes   Zostavax completed No   Shingrix Completed?: No.    Education has been provided regarding the importance of this vaccine. Patient has been advised to call insurance company to determine out of pocket expense if they have not yet received this vaccine. Advised may also receive vaccine at local pharmacy or Health Dept. Verbalized acceptance and understanding.  Screening Tests Health Maintenance  Topic Date Due   Pneumonia Vaccine 66+ Years old (1 - PCV) Never done   MAMMOGRAM  06/24/2021   COVID-19 Vaccine (1) 07/04/2021 (Originally 05/31/1946)   INFLUENZA VACCINE  08/07/2021 (Originally 12/08/2020)   Zoster Vaccines- Shingrix (1  of 2) 09/15/2021 (Originally 11/27/1996)   TETANUS/TDAP  06/18/2022 (Originally 11/27/1964)   COLONOSCOPY (Pts 45-60yrs Insurance coverage will need to be confirmed)  02/16/2028   DEXA SCAN  Completed   Hepatitis C Screening  Completed   HPV VACCINES  Aged Out    Health Maintenance  Health Maintenance Due  Topic Date Due   Pneumonia Vaccine 13+ Years old (1 - PCV) Never done  MAMMOGRAM  06/24/2021    Colorectal cancer screening: Type of screening: Colonoscopy. Completed 02/15/18. Repeat every 10 years  Mammogram status: Completed 06/24/20. Repeat every year  Bone Density status: Completed 06/24/20. Results reflect: Bone density results: OSTEOPENIA. Repeat every 2 years.  Lung Cancer Screening: (Low Dose CT Chest recommended if Age 63-80 years, 30 pack-year currently smoking OR have quit w/in 15years.) does not qualify.   Additional Screening:  Hepatitis C Screening: does qualify; Completed 06/18/21  Vision Screening: Recommended annual ophthalmology exams for early detection of glaucoma and other disorders of the eye. Is the patient up to date with their annual eye exam?  Yes  Who is the provider or what is the name of the office in which the patient attends annual eye exams? [facility].   Dental Screening: Recommended annual dental exams for proper oral hygiene  Community Resource Referral / Chronic Care Management: CRR required this visit?  No   CCM required this visit?  No      Plan:     I have personally reviewed and noted the following in the patients chart:   Medical and social history Use of alcohol, tobacco or illicit drugs  Current medications and supplements including opioid prescriptions. Patient is not currently taking opioid prescriptions. Functional ability and status Nutritional status Physical activity Advanced directives List of other physicians Hospitalizations, surgeries, and ER visits in previous 12 months Vitals Screenings to include cognitive,  depression, and falls Referrals and appointments  In addition, I have reviewed and discussed with patient certain preventive protocols, quality metrics, and best practice recommendations. A written personalized care plan for preventive services as well as general preventive health recommendations were provided to patient.     Reather Littler, LPN   6/38/9373   Nurse Notes: pt states she has a spot on her face and hand, inquired about dermatology referral. Pt provided with phone # to Dr. Jesusita Oka who she saw in 2021.

## 2021-06-29 NOTE — Patient Instructions (Signed)
April Russell , Thank you for taking time to come for your Medicare Wellness Visit. I appreciate your ongoing commitment to your health goals. Please review the following plan we discussed and let me know if I can assist you in the future.   Screening recommendations/referrals: Colonoscopy: done 02/15/18 Mammogram: done 06/24/20 Bone Density: done 06/24/20 Recommended yearly ophthalmology/optometry visit for glaucoma screening and checkup Recommended yearly dental visit for hygiene and checkup  Call Dr. Eula ListenCheree Ditto for dermatology at [phone]  Vaccinations: Influenza vaccine: declined Pneumococcal vaccine: declined Tdap vaccine: due Shingles vaccine: declined   Covid-19:declined  Advanced directives: Advance directive discussed with you today. I have provided a copy for you to complete at home and have notarized. Once this is complete please bring a copy in to our office so we can scan it into your chart.   Conditions/risks identified: Recommend healthy eating and physical activity to lower cholesterol  Next appointment: Follow up in one year for your annual wellness visit    Preventive Care 65 Years and Older, Female Preventive care refers to lifestyle choices and visits with your health care provider that can promote health and wellness. What does preventive care include? A yearly physical exam. This is also called an annual well check. Dental exams once or twice a year. Routine eye exams. Ask your health care provider how often you should have your eyes checked. Personal lifestyle choices, including: Daily care of your teeth and gums. Regular physical activity. Eating a healthy diet. Avoiding tobacco and drug use. Limiting alcohol use. Practicing safe sex. Taking low-dose aspirin every day. Taking vitamin and mineral supplements as recommended by your health care provider. What happens during an annual well check? The services and screenings done by your health care  provider during your annual well check will depend on your age, overall health, lifestyle risk factors, and family history of disease. Counseling  Your health care provider may ask you questions about your: Alcohol use. Tobacco use. Drug use. Emotional well-being. Home and relationship well-being. Sexual activity. Eating habits. History of falls. Memory and ability to understand (cognition). Work and work Astronomer. Reproductive health. Screening  You may have the following tests or measurements: Height, weight, and BMI. Blood pressure. Lipid and cholesterol levels. These may be checked every 5 years, or more frequently if you are over 50 years old. Skin check. Lung cancer screening. You may have this screening every year starting at age 15 if you have a 30-pack-year history of smoking and currently smoke or have quit within the past 15 years. Fecal occult blood test (FOBT) of the stool. You may have this test every year starting at age 70. Flexible sigmoidoscopy or colonoscopy. You may have a sigmoidoscopy every 5 years or a colonoscopy every 10 years starting at age 19. Hepatitis C blood test. Hepatitis B blood test. Sexually transmitted disease (STD) testing. Diabetes screening. This is done by checking your blood sugar (glucose) after you have not eaten for a while (fasting). You may have this done every 1-3 years. Bone density scan. This is done to screen for osteoporosis. You may have this done starting at age 3. Mammogram. This may be done every 1-2 years. Talk to your health care provider about how often you should have regular mammograms. Talk with your health care provider about your test results, treatment options, and if necessary, the need for more tests. Vaccines  Your health care provider may recommend certain vaccines, such as: Influenza vaccine. This is recommended every year.  Tetanus, diphtheria, and acellular pertussis (Tdap, Td) vaccine. You may need a Td  booster every 10 years. Zoster vaccine. You may need this after age 97. Pneumococcal 13-valent conjugate (PCV13) vaccine. One dose is recommended after age 14. Pneumococcal polysaccharide (PPSV23) vaccine. One dose is recommended after age 78. Talk to your health care provider about which screenings and vaccines you need and how often you need them. This information is not intended to replace advice given to you by your health care provider. Make sure you discuss any questions you have with your health care provider. Document Released: 05/23/2015 Document Revised: 01/14/2016 Document Reviewed: 02/25/2015 Elsevier Interactive Patient Education  2017 [place] Prevention in the Home Falls can cause injuries. They can happen to people of all ages. There are many things you can do to make your home safe and to help prevent falls. What can I do on the outside of my home? Regularly fix the edges of walkways and driveways and fix any cracks. Remove anything that might make you trip as you walk through a door, such as a raised step or threshold. Trim any bushes or trees on the path to your home. Use bright outdoor lighting. Clear any walking paths of anything that might make someone trip, such as rocks or tools. Regularly check to see if handrails are loose or broken. Make sure that both sides of any steps have handrails. Any raised decks and porches should have guardrails on the edges. Have any leaves, snow, or ice cleared regularly. Use sand or salt on walking paths during winter. Clean up any spills in your garage right away. This includes oil or grease spills. What can I do in the bathroom? Use night lights. Install grab bars by the toilet and in the tub and shower. Do not use towel bars as grab bars. Use non-skid mats or decals in the tub or shower. If you need to sit down in the shower, use a plastic, non-slip stool. Keep the floor dry. Clean up any water that spills on the floor  as soon as it happens. Remove soap buildup in the tub or shower regularly. Attach bath mats securely with double-sided non-slip rug tape. Do not have throw rugs and other things on the floor that can make you trip. What can I do in the bedroom? Use night lights. Make sure that you have a light by your bed that is easy to reach. Do not use any sheets or blankets that are too big for your bed. They should not hang down onto the floor. Have a firm chair that has side arms. You can use this for support while you get dressed. Do not have throw rugs and other things on the floor that can make you trip. What can I do in the kitchen? Clean up any spills right away. Avoid walking on wet floors. Keep items that you use a lot in easy-to-reach places. If you need to reach something above you, use a strong step stool that has a grab bar. Keep electrical cords out of the way. Do not use floor polish or wax that makes floors slippery. If you must use wax, use non-skid floor wax. Do not have throw rugs and other things on the floor that can make you trip. What can I do with my stairs? Do not leave any items on the stairs. Make sure that there are handrails on both sides of the stairs and use them. Fix handrails that are broken or loose.  Make sure that handrails are as long as the stairways. Check any carpeting to make sure that it is firmly attached to the stairs. Fix any carpet that is loose or worn. Avoid having throw rugs at the top or bottom of the stairs. If you do have throw rugs, attach them to the floor with carpet tape. Make sure that you have a light switch at the top of the stairs and the bottom of the stairs. If you do not have them, ask someone to add them for you. What else can I do to help prevent falls? Wear shoes that: Do not have high heels. Have rubber bottoms. Are comfortable and fit you well. Are closed at the toe. Do not wear sandals. If you use a stepladder: Make sure that it is  fully opened. Do not climb a closed stepladder. Make sure that both sides of the stepladder are locked into place. Ask someone to hold it for you, if possible. Clearly mark and make sure that you can see: Any grab bars or handrails. First and last steps. Where the edge of each step is. Use tools that help you move around (mobility aids) if they are needed. These include: Canes. Walkers. Scooters. Crutches. Turn on the lights when you go into a dark area. Replace any light bulbs as soon as they burn out. Set up your furniture so you have a clear path. Avoid moving your furniture around. If any of your floors are uneven, fix them. If there are any pets around you, be aware of where they are. Review your medicines with your doctor. Some medicines can make you feel dizzy. This can increase your chance of falling. Ask your doctor what other things that you can do to help prevent falls. This information is not intended to replace advice given to you by your health care provider. Make sure you discuss any questions you have with your health care provider. Document Released: 02/20/2009 Document Revised: 10/02/2015 Document Reviewed: 05/31/2014 Elsevier Interactive Patient Education  2017 [employer].

## 2021-07-09 DIAGNOSIS — R300 Dysuria: Secondary | ICD-10-CM | POA: Diagnosis not present

## 2021-07-09 DIAGNOSIS — N390 Urinary tract infection, site not specified: Secondary | ICD-10-CM | POA: Diagnosis not present

## 2021-07-13 ENCOUNTER — Ambulatory Visit (INDEPENDENT_AMBULATORY_CARE_PROVIDER_SITE_OTHER): Payer: Medicare HMO | Admitting: Internal Medicine

## 2021-07-13 ENCOUNTER — Other Ambulatory Visit: Payer: Self-pay | Admitting: Internal Medicine

## 2021-07-13 ENCOUNTER — Ambulatory Visit: Payer: Self-pay

## 2021-07-13 ENCOUNTER — Other Ambulatory Visit: Payer: Self-pay

## 2021-07-13 ENCOUNTER — Encounter: Payer: Self-pay | Admitting: Internal Medicine

## 2021-07-13 VITALS — BP 134/82 | HR 65 | Ht 62.5 in | Wt 134.0 lb

## 2021-07-13 DIAGNOSIS — R350 Frequency of micturition: Secondary | ICD-10-CM | POA: Diagnosis not present

## 2021-07-13 LAB — POC URINALYSIS WITH MICROSCOPIC (NON AUTO)MANUAL RESULT
Bilirubin, UA: NEGATIVE
Crystals: 0
Glucose, UA: NEGATIVE
Ketones, UA: NEGATIVE
Mucus, UA: 0
Nitrite, UA: NEGATIVE
Protein, UA: NEGATIVE
RBC: 2 M/uL — AB (ref 4.04–5.48)
Spec Grav, UA: <=1.005 — AB (ref 1.010–1.025)
Urobilinogen, UA: 0.2 E.U./dL
WBC Casts, UA: 2
pH, UA: 6 (ref 5.0–8.0)

## 2021-07-13 MED ORDER — CEFUROXIME AXETIL 250 MG PO TABS: 250.0000 mg | ORAL_TABLET | Freq: Two times a day (BID) | ORAL | 0 refills | Status: AC

## 2021-07-13 NOTE — Telephone Encounter (Signed)
? ?  Chief Complaint: Pain with urination ?Symptoms: Abdominal pain ?Frequency: Started last month ?Pertinent Negatives: Patient denies fever ?Disposition: [] ED /[] Urgent Care (no appt availability in office) / [x] Appointment(In office/virtual)/ []  [place] [facility]/ [] Home Care/ [] Refused Recommended Disposition /[] [place] [facility]/ []  Follow-up with PCP ?Additional Notes:   ? ? ?Reason for Disposition ? Urinating more frequently than usual (i.e., frequency) ? ?Answer Assessment - Initial Assessment Questions ?1. SYMPTOM: "What's the main symptom you're concerned about?" (e.g., frequency, incontinence) ?    Pain with urination ?2. ONSET: "When did the  symptoms  start?" ?    Last month ?3. PAIN: "Is there any pain?" If Yes, ask: "How bad is it?" (Scale: 1-10; mild, moderate, severe) ?    Now - 6 ?4. CAUSE: "What do you think is causing the symptoms?" ?    UTI ?5. OTHER SYMPTOMS: "Do you have any other symptoms?" (e.g., fever, flank pain, blood in urine, pain with urination) ?    No ?6. PREGNANCY: "Is there any chance you are pregnant?" "When was your last menstrual period?" ?    No ? ?Protocols used: Urinary Symptoms-A-AH ? ?

## 2021-07-13 NOTE — Progress Notes (Signed)
? ? ?Date:  07/13/2021  ? ?Name:  April Russell   DOB:  10/03/1945   MRN:  [id number] ? ? ?Chief Complaint: Urinary Tract Infection ? ?Urinary Tract Infection  ?This is a new problem. The pain is at a severity of 6/10. The pain is mild. There has been no fever. There is No history of pyelonephritis. Associated symptoms include flank pain, frequency and urgency. Pertinent negatives include no chills, hematuria, nausea or vomiting. Treatments tried: AZO, cranberry pills, macrobid. The treatment provided no relief.  ? ?Lab Results  ?Component Value Date  ? NA 139 06/18/2021  ? K 4.5 06/18/2021  ? CO2 22 06/18/2021  ? GLUCOSE 89 06/18/2021  ? BUN 10 06/18/2021  ? CREATININE 1.00 06/18/2021  ? CALCIUM 9.2 06/18/2021  ? EGFR 59 (L) 06/18/2021  ? GFRNONAA 63 02/20/2020  ? ?Lab Results  ?Component Value Date  ? CHOL 194 06/18/2021  ? HDL 52 06/18/2021  ? LDLCALC 115 (H) 06/18/2021  ? TRIG 152 (H) 06/18/2021  ? CHOLHDL 3.7 06/18/2021  ? ?Lab Results  ?Component Value Date  ? TSH 2.110 06/18/2021  ? ?No results found for: HGBA1C ?Lab Results  ?Component Value Date  ? WBC 4.9 06/18/2021  ? HGB 13.1 06/18/2021  ? HCT 40.3 06/18/2021  ? MCV 88 06/18/2021  ? PLT 194 06/18/2021  ? ?Lab Results  ?Component Value Date  ? ALT 12 06/18/2021  ? AST 23 06/18/2021  ? ALKPHOS 66 06/18/2021  ? BILITOT 0.5 06/18/2021  ? ?No results found for: 25OHVITD2, [place], VD25OH  ? ?Review of Systems  ?Constitutional:  Negative for chills, diaphoresis and fatigue.  ?Respiratory:  Negative for chest tightness and shortness of breath.   ?Cardiovascular:  Negative for chest pain.  ?Gastrointestinal:  Positive for abdominal pain. Negative for constipation, diarrhea, nausea and vomiting.  ?Genitourinary:  Positive for flank pain, frequency and urgency. Negative for hematuria.  ?Psychiatric/Behavioral:  Positive for sleep disturbance. Negative for dysphoric mood. The patient is not nervous/anxious.   ? ?Patient Active Problem List  ? Diagnosis Date Noted  ?  Mixed hyperlipidemia 06/19/2021  ? 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 Osteopenia determined by x-ray 01/23/2021  ? Elevated blood pressure reading 01/23/2021  ? Seasonal allergies 02/20/2020  ? Skin lesion 02/20/2020  ? ? ?Allergies  ?Allergen Reactions  ? Macrobid [Nitrofurantoin] Nausea Only  ? Sulfa Antibiotics Hives  ? ? ?Past Surgical History:  ?Procedure Laterality Date  ? ABDOMINAL HYSTERECTOMY  1980  ? colon reconstruction  1980  ? RECONSTRUCTION URETHROPLASTY  1980  ? ? ?Social History  ? ?Tobacco Use  ? Smoking status: Never  ? Smokeless tobacco: Never  ?Vaping Use  ? Vaping Use: Never used  ?Substance Use Topics  ? Alcohol use: Yes  ?  Alcohol/week: 4.0 standard drinks  ?  Types: 4 Glasses of wine per week  ?  Comment: occass  ? Drug use: Never  ? ? ? ?Medication list has been reviewed and updated. ? ?Current Meds  ?Medication Sig  ? acetaminophen (TYLENOL) 500 MG tablet Take 500 mg by mouth every 6 (six) hours as needed.  ? BIOTIN 5000 PO Take by mouth.  ? Cholecalciferol (VITAMIN D3) 125 MCG (5000 UT) CAPS Take by mouth.  ? Cyanocobalamin (VITAMIN B-12 PO) Take by mouth daily.  ? diphenhydrAMINE HCl (BENADRYL PO) Take by mouth daily.  ? folic acid (FOLVITE) 1 MG tablet Take 1 mg by mouth daily.  ? ibuprofen (ADVIL) 200  MG tablet Take 200 mg by mouth every 6 (six) hours as needed. For aching Hands and ankles.  ? Multiple Vitamin (MULTIVITAMIN) tablet Take 1 tablet by mouth daily.  ? omeprazole (PRILOSEC) 10 MG capsule Take 20 mg by mouth daily.  ? Probiotic Product (PROBIOTIC-10 PO) Take by mouth as needed.  ? ? ?PHQ 2/9 Scores 07/13/2021 06/29/2021 06/18/2021 01/23/2021  ?PHQ - 2 Score 0 1 1 2   ?PHQ- 9 Score 2 3 4 8   ? ? ?GAD 7 : Generalized Anxiety Score 07/13/2021 06/18/2021 01/23/2021  ?Nervous, Anxious, on Edge 1 1 1   ?Control/stop worrying 1 1 2   ?Worry too much - different things 2 2 2   ?Trouble relaxing 1 1 1   ?Restless 1 1 1   ?Easily annoyed or irritable 0 0 0  ?Afraid - awful  might happen 0 0 0  ?Total GAD 7 Score 6 6 7   ?Anxiety Difficulty - Somewhat difficult -  ? ? ?BP Readings from Last 3 Encounters:  ?07/13/21 134/82  ?06/18/21 128/76  ?01/23/21 (!) 140/100  ? ? ?Physical Exam ?Vitals and nursing note reviewed.  ?Constitutional:   ?   Appearance: She is well-developed.  ?Cardiovascular:  ?   Rate and Rhythm: Normal rate and regular rhythm.  ?   Heart sounds: Normal heart sounds.  ?Pulmonary:  ?   Effort: Pulmonary effort is normal. No respiratory distress.  ?   Breath sounds: Normal breath sounds.  ?Abdominal:  ?   General: Abdomen is flat. Bowel sounds are normal.  ?   Palpations: Abdomen is soft.  ?   Tenderness: There is abdominal tenderness in the suprapubic area. There is no right CVA tenderness, left CVA tenderness, guarding or rebound.  ? ? ?Wt Readings from Last 3 Encounters:  ?07/13/21 134 lb (60.8 kg)  ?06/18/21 138 lb 3.2 oz (62.7 kg)  ?01/23/21 139 lb (63 kg)  ? ? ?BP 134/82 (BP Location: Right Arm, Cuff Size: Normal)   Pulse 65   Ht 5' 2.5" (1.588 m)   Wt 134 lb (60.8 kg)   SpO2 97%   BMI 24.12 kg/m?  ? ?Assessment and Plan: ?1. Urinary frequency ?Partially treated but unable to tolerated the macrobid ?Will send culture and change therapy to [facility] ?- Urine Culture ?- POC urinalysis w microscopic (non auto) ?- cefUROXime (CEFTIN) 250 MG tablet; Take 1 tablet (250 mg total) by mouth 2 (two) times daily with a meal for 7 days.  Dispense: 14 tablet; Refill: 0 ? ? ?Partially dictated using Editor, commissioning. Any errors are unintentional. ? ?Halina Maidens, MD ?[facility] ?[facility] ? ?07/13/2021 ? ? ? ? ? ?

## 2021-07-15 LAB — URINE CULTURE

## 2021-07-22 ENCOUNTER — Other Ambulatory Visit: Payer: Self-pay

## 2021-07-22 ENCOUNTER — Ambulatory Visit
Admission: RE | Admit: 2021-07-22 | Discharge: 2021-07-22 | Disposition: A | Discharge status: Home / Self Care | Payer: Medicare HMO | Source: Ambulatory Visit | Attending: Internal Medicine | Admitting: Internal Medicine

## 2021-07-22 DIAGNOSIS — Z1231 Encounter for screening mammogram for malignant neoplasm of breast: Secondary | ICD-10-CM | POA: Diagnosis not present

## 2021-07-28 ENCOUNTER — Other Ambulatory Visit: Payer: Self-pay

## 2021-07-28 ENCOUNTER — Encounter: Payer: Self-pay | Admitting: Internal Medicine

## 2021-07-28 ENCOUNTER — Ambulatory Visit (INDEPENDENT_AMBULATORY_CARE_PROVIDER_SITE_OTHER): Payer: Medicare HMO | Admitting: Internal Medicine

## 2021-07-28 VITALS — BP 122/70 | Ht 62.5 in

## 2021-07-28 DIAGNOSIS — R350 Frequency of micturition: Secondary | ICD-10-CM | POA: Diagnosis not present

## 2021-07-28 DIAGNOSIS — S60311A Abrasion of right thumb, initial encounter: Secondary | ICD-10-CM | POA: Diagnosis not present

## 2021-07-28 DIAGNOSIS — M533 Sacrococcygeal disorders, not elsewhere classified: Secondary | ICD-10-CM

## 2021-07-28 DIAGNOSIS — Z23 Encounter for immunization: Secondary | ICD-10-CM

## 2021-07-28 LAB — POCT URINALYSIS DIPSTICK
Bilirubin, UA: NEGATIVE
Blood, UA: NEGATIVE
Glucose, UA: NEGATIVE
Ketones, UA: NEGATIVE
Leukocytes, UA: NEGATIVE
Nitrite, UA: NEGATIVE
Protein, UA: NEGATIVE
Spec Grav, UA: 1.01 (ref 1.010–1.025)
Urobilinogen, UA: 0.2 E.U./dL
pH, UA: 6 (ref 5.0–8.0)

## 2021-07-28 NOTE — Progress Notes (Signed)
? ? ?Date:  07/28/2021  ? ?Name:  April Russell   DOB:  1945-11-18   MRN:  [id number] ? ? ?Chief Complaint: Back Pain ? ?Back Pain ?This is a new problem. The current episode started in the past 7 days. The problem occurs constantly. The problem has been waxing and waning since onset. The pain is present in the lumbar spine. The quality of the pain is described as aching and cramping. The pain does not radiate. The pain is moderate. Pertinent negatives include no abdominal pain, chest pain, dysuria, fever, leg pain, numbness or tingling.  ?Thumb injury - she cut her thumb this AM at home on a mandolin. ?Lab Results  ?Component Value Date  ? NA 139 06/18/2021  ? K 4.5 06/18/2021  ? CO2 22 06/18/2021  ? GLUCOSE 89 06/18/2021  ? BUN 10 06/18/2021  ? CREATININE 1.00 06/18/2021  ? CALCIUM 9.2 06/18/2021  ? EGFR 59 (L) 06/18/2021  ? GFRNONAA 63 02/20/2020  ? ?Lab Results  ?Component Value Date  ? CHOL 194 06/18/2021  ? HDL 52 06/18/2021  ? LDLCALC 115 (H) 06/18/2021  ? TRIG 152 (H) 06/18/2021  ? CHOLHDL 3.7 06/18/2021  ? ?Lab Results  ?Component Value Date  ? TSH 2.110 06/18/2021  ? ?No results found for: HGBA1C ?Lab Results  ?Component Value Date  ? WBC 4.9 06/18/2021  ? HGB 13.1 06/18/2021  ? HCT 40.3 06/18/2021  ? MCV 88 06/18/2021  ? PLT 194 06/18/2021  ? ?Lab Results  ?Component Value Date  ? ALT 12 06/18/2021  ? AST 23 06/18/2021  ? ALKPHOS 66 06/18/2021  ? BILITOT 0.5 06/18/2021  ? ?No results found for: 25OHVITD2, [place], VD25OH  ? ?Review of Systems  ?Constitutional:  Negative for chills, fatigue and fever.  ?Respiratory:  Negative for shortness of breath.   ?Cardiovascular:  Negative for chest pain and palpitations.  ?Gastrointestinal:  Negative for abdominal pain.  ?Genitourinary:  Positive for frequency and urgency. Negative for dysuria, hematuria, vaginal bleeding and vaginal pain.  ?Musculoskeletal:  Positive for back pain.  ?Skin:  Positive for wound (right thumb abrasion at home this AM).  ?Neurological:   Negative for tingling and numbness.  ?Psychiatric/Behavioral:  Negative for dysphoric mood and sleep disturbance. The patient is not nervous/anxious.   ? ?Patient Active Problem List  ? Diagnosis Date Noted  ? Mixed hyperlipidemia 06/19/2021  ? 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 Osteopenia determined by x-ray 01/23/2021  ? Elevated blood pressure reading 01/23/2021  ? Seasonal allergies 02/20/2020  ? Skin lesion 02/20/2020  ? ? ?Allergies  ?Allergen Reactions  ? Macrobid [Nitrofurantoin] Nausea Only  ? Sulfa Antibiotics Hives  ? ? ?Past Surgical History:  ?Procedure Laterality Date  ? ABDOMINAL HYSTERECTOMY  1980  ? colon reconstruction  1980  ? RECONSTRUCTION URETHROPLASTY  1980  ? ? ?Social History  ? ?Tobacco Use  ? Smoking status: Never  ? Smokeless tobacco: Never  ?Vaping Use  ? Vaping Use: Never used  ?Substance Use Topics  ? Alcohol use: Yes  ?  Alcohol/week: 4.0 standard drinks  ?  Types: 4 Glasses of wine per week  ?  Comment: occass  ? Drug use: Never  ? ? ? ?Medication list has been reviewed and updated. ? ?Current Meds  ?Medication Sig  ? acetaminophen (TYLENOL) 500 MG tablet Take 500 mg by mouth every 6 (six) hours as needed.  ? atorvastatin (LIPITOR) 10 MG tablet Take 1 tablet (10 mg total)  by mouth daily.  ? BIOTIN 5000 PO Take by mouth.  ? Cholecalciferol (VITAMIN D3) 125 MCG (5000 UT) CAPS Take by mouth.  ? Cyanocobalamin (VITAMIN B-12 PO) Take by mouth daily.  ? diphenhydrAMINE HCl (BENADRYL PO) Take by mouth daily.  ? folic acid (FOLVITE) 1 MG tablet Take 1 mg by mouth daily.  ? ibuprofen (ADVIL) 200 MG tablet Take 200 mg by mouth every 6 (six) hours as needed. For aching Hands and ankles.  ? Multiple Vitamin (MULTIVITAMIN) tablet Take 1 tablet by mouth daily.  ? omeprazole (PRILOSEC) 10 MG capsule Take 20 mg by mouth daily.  ? Probiotic Product (PROBIOTIC-10 PO) Take by mouth as needed.  ? ? ?PHQ 2/9 Scores 07/28/2021 07/13/2021 06/29/2021 06/18/2021  ?PHQ - 2 Score 1 0 1  1  ?PHQ- 9 Score $Remov'4 2 3 4  'HAGdPy$ ? ? ?GAD 7 : Generalized Anxiety Score 07/28/2021 07/13/2021 06/18/2021 01/23/2021  ?Nervous, Anxious, on Edge 0 $Remo'1 1 1  'tVCIU$ ?Control/stop worrying 0 $RemoveBe'1 1 2  'PGtkYeHRq$ ?Worry too much - different things $RemoveBeforeDE'1 2 2 2  'xrfjCETYTqBhDcR$ ?Trouble relaxing $RemoveBeforeDEI'1 1 1 1  'ZALsIKHrzfWNnSEp$ ?Restless $RemoveBeforeDE'1 1 1 1  'TqjoBfCDVsphxuc$ ?Easily annoyed or irritable 0 0 0 0  ?Afraid - awful might happen 0 0 0 0  ?Total GAD 7 Score $Remov'3 6 6 7  'uwYPoN$ ?Anxiety Difficulty Somewhat difficult - Somewhat difficult -  ? ? ?BP Readings from Last 3 Encounters:  ?07/28/21 122/70  ?07/13/21 134/82  ?06/18/21 128/76  ? ? ?Physical Exam ?Vitals and nursing note reviewed.  ?Constitutional:   ?   General: She is not in acute distress. ?   Appearance: Normal appearance. She is well-developed.  ?HENT:  ?   Head: Normocephalic and atraumatic.  ?Cardiovascular:  ?   Rate and Rhythm: Normal rate and regular rhythm.  ?Pulmonary:  ?   Effort: Pulmonary effort is normal. No respiratory distress.  ?   Breath sounds: No wheezing or rhonchi.  ?Abdominal:  ?   Tenderness: There is no right CVA tenderness or left CVA tenderness.  ?Musculoskeletal:  ?   Cervical back: Normal range of motion.  ?   Lumbar back: Tenderness (over right SI region) present. Negative right straight leg raise test and negative left straight leg raise test.  ?   Right lower leg: No edema.  ?   Left lower leg: No edema.  ?Lymphadenopathy:  ?   Cervical: No cervical adenopathy.  ?Skin: ?   General: Skin is warm and dry.  ?   Findings: Laceration present. No rash.  ?   Comments: And abrasion of inner right thumb  ?Neurological:  ?   Mental Status: She is alert and oriented to person, place, and time.  ?   Deep Tendon Reflexes: Reflexes are normal and symmetric.  ?Psychiatric:     ?   Mood and Affect: Mood normal.     ?   Behavior: Behavior normal.  ? ? ?Wt Readings from Last 3 Encounters:  ?07/13/21 134 lb (60.8 kg)  ?06/18/21 138 lb 3.2 oz (62.7 kg)  ?01/23/21 139 lb (63 kg)  ? ? ?BP 122/70   Ht 5' 2.5" (1.588 m)   BMI 24.12 kg/m?  ? ?Assessment and  Plan: ?1. Urinary frequency ?No evidence of UTI on UA ?- POCT Urinalysis Dipstick ? ?2. Sacroiliac joint pain ?Recommend heat or ice plus Tylenol ?Avoid twisting and straining ?No evidence of sciatic nerve involvement ? ?3. Abrasion of right thumb, initial encounter ?Continue local care - follow  up if s/s of infection occur ?- Tdap vaccine greater than or equal to 7yo IM ? ? ?Partially dictated using Editor, commissioning. Any errors are unintentional. ? ?Halina Maidens, MD ?[facility] ?[facility] ? ?07/28/2021 ? ? ? ? ? ?

## 2021-07-28 NOTE — Patient Instructions (Signed)
Use heat or ice on lower back. ?Take tylenol or other anti-inflammatory as needed ?

## 2021-07-30 ENCOUNTER — Other Ambulatory Visit: Payer: Self-pay | Admitting: Internal Medicine

## 2021-07-30 ENCOUNTER — Telehealth: Payer: Self-pay | Admitting: Internal Medicine

## 2021-07-30 DIAGNOSIS — R350 Frequency of micturition: Secondary | ICD-10-CM

## 2021-07-30 NOTE — Telephone Encounter (Signed)
Copied from CRM [id number]. Topic: Referral - Request for Referral ?>> Jul 30, 2021  7:50 AM Payton Spark N wrote: ?Has patient seen PCP for this complaint? Yes.   ? ?Referral for which specialty: Urologist ? ?Preferred provider/office: [facility] ? ?Reason for referral: Bladder issues ?

## 2021-07-30 NOTE — Telephone Encounter (Signed)
Pt calling back to follow up on request for referral. ? ?Pt requesting a call back.  ?

## 2021-07-30 NOTE — Telephone Encounter (Signed)
Please review.  KP

## 2021-08-05 ENCOUNTER — Encounter: Payer: Self-pay | Admitting: Internal Medicine

## 2021-08-06 ENCOUNTER — Other Ambulatory Visit: Payer: Self-pay | Admitting: Internal Medicine

## 2021-08-06 DIAGNOSIS — R350 Frequency of micturition: Secondary | ICD-10-CM

## 2021-08-06 MED ORDER — OXYBUTYNIN CHLORIDE ER 5 MG PO TB24: 5.0000 mg | ORAL_TABLET | Freq: Every day | ORAL | 0 refills | Status: DC

## 2021-08-07 ENCOUNTER — Emergency Department
Admission: EM | Admit: 2021-08-07 | Discharge: 2021-08-07 | Disposition: A | Discharge status: Home / Self Care | Payer: Medicare HMO | Attending: Emergency Medicine | Admitting: Emergency Medicine

## 2021-08-07 ENCOUNTER — Other Ambulatory Visit: Payer: Self-pay

## 2021-08-07 ENCOUNTER — Emergency Department: Payer: Medicare HMO

## 2021-08-07 DIAGNOSIS — R109 Unspecified abdominal pain: Secondary | ICD-10-CM | POA: Diagnosis not present

## 2021-08-07 DIAGNOSIS — F419 Anxiety disorder, unspecified: Secondary | ICD-10-CM | POA: Insufficient documentation

## 2021-08-07 DIAGNOSIS — R110 Nausea: Secondary | ICD-10-CM | POA: Diagnosis not present

## 2021-08-07 DIAGNOSIS — N390 Urinary tract infection, site not specified: Secondary | ICD-10-CM | POA: Diagnosis not present

## 2021-08-07 DIAGNOSIS — M545 Low back pain, unspecified: Secondary | ICD-10-CM | POA: Insufficient documentation

## 2021-08-07 DIAGNOSIS — K5730 Diverticulosis of large intestine without perforation or abscess without bleeding: Secondary | ICD-10-CM | POA: Diagnosis not present

## 2021-08-07 DIAGNOSIS — K449 Diaphragmatic hernia without obstruction or gangrene: Secondary | ICD-10-CM | POA: Diagnosis not present

## 2021-08-07 DIAGNOSIS — N83202 Unspecified ovarian cyst, left side: Secondary | ICD-10-CM | POA: Diagnosis not present

## 2021-08-07 DIAGNOSIS — R399 Unspecified symptoms and signs involving the genitourinary system: Secondary | ICD-10-CM | POA: Diagnosis present

## 2021-08-07 DIAGNOSIS — N2889 Other specified disorders of kidney and ureter: Secondary | ICD-10-CM | POA: Diagnosis not present

## 2021-08-07 LAB — URINALYSIS, ROUTINE W REFLEX MICROSCOPIC
Bilirubin Urine: NEGATIVE
Glucose, UA: NEGATIVE mg/dL
Ketones, ur: 5 mg/dL — AB
Nitrite: NEGATIVE
Protein, ur: NEGATIVE mg/dL
Specific Gravity, Urine: 1.008 (ref 1.005–1.030)
Squamous Epithelial / HPF: NONE SEEN (ref 0–5)
WBC, UA: >50 WBC/hpf — ABNORMAL HIGH (ref 0–5)
pH: 6 (ref 5.0–8.0)

## 2021-08-07 MED ORDER — CEPHALEXIN 500 MG PO CAPS: 500.0000 mg | ORAL_CAPSULE | Freq: Three times a day (TID) | ORAL | 0 refills | Status: DC

## 2021-08-07 NOTE — ED Notes (Signed)
Bladder infection since January. Tried two different antibiotics patient stop taking the first set, and the second she finished but didn't work. Patient is having a lot pain (8) on the pain scale. Cramping when urinating with lower back pain.   ?

## 2021-08-07 NOTE — ED Triage Notes (Addendum)
Pt comes with c/o possible UTI. Pt state she was on antibiotics back in Jan. Pt states she feels like it hasn't even gotten better since.  ? ?Pt states lower back pain as well. Pt denies any fevers. ? ?Pt seen by PCP and UA completed with neg results for UTI. ?

## 2021-08-07 NOTE — Discharge Instructions (Addendum)
Keep your appointment with the urologist for further testing and recheck of your urine to make sure that the infection has completely cleared.  Drink lots of fluids to stay hydrated.  You may take Tylenol as needed for discomfort.  A culture was done of your urine to see what type of bacteria grows out. ?

## 2021-08-07 NOTE — ED Notes (Signed)
See triage note  presents with possible UTI  states her sx's started in Jan  has been on 2 different antibiotics  but she is still having pain in mid to lower back ?

## 2021-08-07 NOTE — ED Provider Notes (Signed)
? ?[facility] ?Provider Note ? ? ? Event Date/Time  ? First MD Initiated Contact with Patient 08/07/21 [phone]   ?  (approximate) ? ? ?History  ? ?UTI ? ? ?HPI ? ?April Russell is a 76 y.o. female presents to the ED with complaint of urinary symptoms.  Patient's dates that she initially began having problems in January and was put on 2 antibiotics which have not helped her.  She states she continues to have low back pain without nausea, vomiting, fever or chills.  She has been taking Tylenol with minimal relief.  She has taken Ceftin and Macrobid both of which did not help her and caused moderate nausea.   ?  ? ? ?Physical Exam  ? ?Triage Vital Signs: ?ED Triage Vitals  ?Enc Vitals Group  ?   BP 08/07/21 0725 (!) 147/50  ?   Pulse Rate 08/07/21 0725 63  ?   Resp 08/07/21 0725 18  ?   Temp 08/07/21 0725 97.8 ?F (36.6 ?C)  ?   Temp Source 08/07/21 0725 Oral  ?   SpO2 08/07/21 0725 100 %  ?   Weight 08/07/21 0741 134 lb 0.6 oz (60.8 kg)  ?   Height 08/07/21 0741 5' 2.25" (1.581 m)  ?   Head Circumference --   ?   Peak Flow --   ?   Pain Score 08/07/21 0733 7  ?   Pain Loc --   ?   Pain Edu? --   ?   Excl. in GC? --   ? ? ?Most recent vital signs: ?Vitals:  ? 08/07/21 0725 08/07/21 0900  ?BP: (!) 147/50 (!) 140/55  ?Pulse: 63 68  ?Resp: 18 18  ?Temp: 97.8 ?F (36.6 ?C)   ?SpO2: 100% 100%  ? ? ? ?General: Awake, no distress.  ?CV:  Good peripheral perfusion.  Heart regular rate and rhythm. ?Resp:  Normal effort.'s are clear. ?Abd:  No distention.  Soft, nontender.  No CVA tenderness noted. ?  ? ? ?ED Results / Procedures / Treatments  ? ?Labs ?(all labs ordered are listed, but only abnormal results are displayed) ?Labs Reviewed  ?URINALYSIS, ROUTINE W REFLEX MICROSCOPIC - Abnormal; Notable for the following components:  ?    Result Value  ? Color, Urine YELLOW (*)   ? APPearance CLOUDY (*)   ? Hgb urine dipstick MODERATE (*)   ? Ketones, ur 5 (*)   ? Leukocytes,Ua LARGE (*)   ? WBC, UA >50 (*)   ?  Bacteria, UA RARE (*)   ? All other components within normal limits  ?URINE CULTURE  ? ? ? ? ?RADIOLOGY ? ?CT renal stone study was reviewed by myself and radiology report reviewed viewed showing no stones present.  Patient does have a ovarian cyst on the left.  Also has a very small right renal cyst present. ? ? ?PROCEDURES: ? ?Critical Care performed:  ? ?Procedures ? ? ?MEDICATIONS ORDERED IN ED: ?Medications - No data to display ? ? ?IMPRESSION / MDM / ASSESSMENT AND PLAN / ED COURSE  ?I reviewed the triage vital signs and the nursing notes. ? ? ?Differential diagnosis includes, but is not limited to, recurrent urinary tract infection, renal stone, lumbar spine pain. ? ?75 year old female presents to the ED with urinary symptoms.  She states that since January she has been taking antibiotics from various providers without any relief of her symptoms.  Patient states that she was seen by her PCP recently who  told her that her urine had cleared.  She has taken both Ceftin and Macrobid without any relief known to her.  Today's urinalysis shows 11-20 RBCs and greater than 50,000 WBCs with rare bacteria.  A culture was ordered on the specimen.  I discussed with patient that the need for a antibiotic at this time is necessary.  Patient states that she is very anxious about medication as they usually make her feel sick.  She also has an appointment with a urologist next month but does not recall the name of the urologist that she is seeing.  She is reassured that this is not a kidney stone.  A prescription for cephalexin 500 mg 3 times daily for 10 days was sent to her pharmacy.  She will be called if the urine culture shows a different antibiotic that would cover better.  Patient was afebrile and no CVA tenderness was noted.  Patient was given information about returning to the emergency department if her symptoms worsen over the weekend. ? ? ? ?  ? ? ?FINAL CLINICAL IMPRESSION(S) / ED DIAGNOSES  ? ?Final diagnoses:   ?Urinary tract infection with hematuria, site unspecified  ? ? ? ?Rx / DC Orders  ? ?ED Discharge Orders   ? ?      Ordered  ?  cephALEXin (KEFLEX) 500 MG capsule  3 times daily       ? 08/07/21 4656  ? ?  ?  ? ?  ? ? ? ?Note:  This document was prepared using Dragon voice recognition software and may include unintentional dictation errors. ?  ?Tommi Rumps, PA-C ?08/07/21 1458 ? ?  ?Sharyn Creamer, MD ?08/07/21 1601 ? ?

## 2021-08-10 LAB — URINE CULTURE: Culture: >=100000 — AB

## 2021-08-13 ENCOUNTER — Encounter: Payer: Self-pay | Admitting: Internal Medicine

## 2021-08-13 ENCOUNTER — Telehealth: Payer: Self-pay | Admitting: Internal Medicine

## 2021-08-13 NOTE — Telephone Encounter (Signed)
Duplicated message will respond to pt on Mychart. ? ?KP ?

## 2021-08-13 NOTE — Telephone Encounter (Signed)
Copied from CRM [id number]. Topic: General - Other ?>> Aug 13, 2021 10:30 AM Pawlus, Maxine Glenn A wrote: ?Reason for CRM: Pt went to the ED on 3/31 and gave a urine specimen, pt wanted to know if Dr Judithann Graves could review her lab results from the hospital, pt wanted more information regarding her results, please advise. ?

## 2021-08-17 ENCOUNTER — Ambulatory Visit: Payer: Medicare HMO | Admitting: Urology

## 2021-08-17 VITALS — BP 179/102 | HR 65 | Ht 62.0 in | Wt 134.0 lb

## 2021-08-17 DIAGNOSIS — N281 Cyst of kidney, acquired: Secondary | ICD-10-CM | POA: Diagnosis not present

## 2021-08-17 DIAGNOSIS — N3020 Other chronic cystitis without hematuria: Secondary | ICD-10-CM | POA: Diagnosis not present

## 2021-08-17 DIAGNOSIS — R350 Frequency of micturition: Secondary | ICD-10-CM | POA: Diagnosis not present

## 2021-08-17 LAB — URINALYSIS, COMPLETE
Bilirubin, UA: NEGATIVE
Glucose, UA: NEGATIVE
Ketones, UA: NEGATIVE
Leukocytes,UA: NEGATIVE
Nitrite, UA: NEGATIVE
Protein,UA: NEGATIVE
Specific Gravity, UA: 1.015 (ref 1.005–1.030)
Urobilinogen, Ur: 0.2 mg/dL (ref 0.2–1.0)
pH, UA: 7 (ref 5.0–7.5)

## 2021-08-17 LAB — MICROSCOPIC EXAMINATION
Bacteria, UA: NONE SEEN
RBC, Urine: NONE SEEN /hpf (ref 0–2)

## 2021-08-17 MED ORDER — CEPHALEXIN 250 MG PO CAPS: 250.0000 mg | ORAL_CAPSULE | Freq: Every day | ORAL | 11 refills | Status: DC

## 2021-08-17 NOTE — Patient Instructions (Signed)

## 2021-08-17 NOTE — Progress Notes (Signed)
? ?08/17/2021 ?10:40 AM  ? ?April Russell ?[birth year] ?[id number] ? ?Referring provider: Reubin Milan, MD ?[address] ?[address] ?[place],  [place] [address] ? ?Chief Complaint  ?Patient presents with  ? Urinary Frequency  ? ? ?HPI: ?Since January patient has been having likely urinary tract infections.  She was having discomfort in the suprapubic area back pain and frequency.  She took half a prescription due to nausea and followed up with 2 more prescriptions eventually recently in the emergency room.  She still has a suprapubic aching.  Her frequency has improved back to baseline every 2 hours.  She normally gets up sometimes once a night ? ?She may have had prolapse surgery before.  She has had a hysterectomy. ? ?At baseline she is continent ? ?Patient had CT scan August 07, 2021.  She had normal kidneys except a small indeterminate right renal lesion incompletely characterized without contrast.  It was an exophytic nodule projecting posteriorly.  She has some gas in the bladder.  Renal ultrasound was recommended. ? ?No history of kidney stones.  No neurologic issues.  No significant UTI history ? ? ? ? ?PMH: ?Past Medical History:  ?Diagnosis Date  ? Hyperlipidemia   ? ? ?Surgical History: ?Past Surgical History:  ?Procedure Laterality Date  ? ABDOMINAL HYSTERECTOMY  1980  ? colon reconstruction  1980  ? RECONSTRUCTION URETHROPLASTY  1980  ? ? ?Home Medications:  ?Allergies as of 08/17/2021   ? ?   Reactions  ? Macrobid [nitrofurantoin] Nausea Only  ? Atorvastatin   ? Ceftin [cefuroxime]   ? Sulfa Antibiotics Hives  ? ?  ? ?  ?Medication List  ?  ? ?  ? Accurate as of August 17, 2021 10:40 AM. If you have any questions, ask your nurse or doctor.  ?  ?  ? ?  ? ?STOP taking these medications   ? ?atorvastatin 10 MG tablet ?Commonly known as: LIPITOR ?  ?BENADRYL PO ?  ?cephALEXin 500 MG capsule ?Commonly known as: KEFLEX ?  ?ibuprofen 200 MG tablet ?Commonly known as: ADVIL ?  ?omeprazole 10 MG capsule ?Commonly  known as: PRILOSEC ?  ?oxybutynin 5 MG 24 hr tablet ?Commonly known as: Ditropan XL ?  ? ?  ? ?TAKE these medications   ? ?acetaminophen 500 MG tablet ?Commonly known as: TYLENOL ?Take 500 mg by mouth every 6 (six) hours as needed. ?  ?BIOTIN 5000 PO ?Take by mouth. ?  ?folic acid 1 MG tablet ?Commonly known as: FOLVITE ?Take 1 mg by mouth daily. ?  ?multivitamin tablet ?Take 1 tablet by mouth daily. ?  ?PROBIOTIC-10 PO ?Take by mouth as needed. ?  ?VITAMIN B-12 PO ?Take by mouth daily. ?  ?Vitamin D3 125 MCG (5000 UT) Caps ?Take by mouth. ?  ? ?  ? ? ?Allergies:  ?Allergies  ?Allergen Reactions  ? Macrobid [Nitrofurantoin] Nausea Only  ? Atorvastatin   ? Ceftin [Cefuroxime]   ? Sulfa Antibiotics Hives  ? ? ?Family History: ?Family History  ?Problem Relation Age of Onset  ? Hypertension Mother   ? Cancer Father   ? ? ?Social History:  reports that she has never smoked. She has never used smokeless tobacco. She reports current alcohol use of about 4.0 standard drinks per week. She reports that she does not use drugs. ? ?ROS: ?  ? ?  ? ?  ? ?  ? ?  ? ?  ? ?  ? ?  ? ?  ? ?  ? ?  ? ?  ? ?  ? ?  Physical Exam: ?BP (!) 179/102   Pulse 65   Ht 5\' 2"  (1.575 m)   Wt 60.8 kg   BMI 24.51 kg/m?   ?Constitutional:  Alert and oriented, No acute distress. ? ?Laboratory Data: ?Lab Results  ?Component Value Date  ? WBC 4.9 06/18/2021  ? HGB 13.1 06/18/2021  ? HCT 40.3 06/18/2021  ? MCV 88 06/18/2021  ? PLT 194 06/18/2021  ? ? ?Lab Results  ?Component Value Date  ? CREATININE 1.00 06/18/2021  ? ? ?No results found for: PSA ? ?No results found for: TESTOSTERONE ? ?No results found for: HGBA1C ? ?Urinalysis ?   ?Component Value Date/Time  ? COLORURINE YELLOW (A) 08/07/2021 0731  ? APPEARANCEUR CLOUDY (A) 08/07/2021 0731  ? LABSPEC 1.008 08/07/2021 0731  ? PHURINE 6.0 08/07/2021 0731  ? GLUCOSEU NEGATIVE 08/07/2021 0731  ? HGBUR MODERATE (A) 08/07/2021 0731  ? BILIRUBINUR NEGATIVE 08/07/2021 0731  ? BILIRUBINUR neg 07/28/2021 1338  ?  KETONESUR 5 (A) 08/07/2021 0731  ? PROTEINUR NEGATIVE 08/07/2021 0731  ? UROBILINOGEN 0.2 07/28/2021 1338  ? NITRITE NEGATIVE 08/07/2021 0731  ? LEUKOCYTESUR LARGE (A) 08/07/2021 0731  ? ? ?Pertinent Imaging: ?Urine reviewed.  Urine sent for culture.  Chart reviewed ? ?Assessment & Plan: Pathophysiology of recurrent bladder infections discussed.  Urine sent for culture but looked normal today.  Hopefully her discomfort will down regulate on Keflex 250 mg daily 30x11.  It appears she may have had nausea from the sulfa drug and Macrodantin.  I will perform cystoscopy in the next 6 weeks.  I will call if the ultrasound is abnormal.  Her last urine culture was positive and one of the recent culture was negative.  Role of probiotics discussed ? ?1. Urine frequency ? ?- Urinalysis, Complete ? ? ?No follow-ups on file. ? ?08/09/2021, MD ? ?[facility] ?[address], [address] ?[place], [place] ?[phone]  ?

## 2021-08-21 LAB — CULTURE, URINE COMPREHENSIVE

## 2021-08-28 ENCOUNTER — Ambulatory Visit
Admission: RE | Admit: 2021-08-28 | Discharge: 2021-08-28 | Disposition: A | Discharge status: Home / Self Care | Payer: Medicare HMO | Source: Ambulatory Visit | Attending: Urology | Admitting: Urology

## 2021-08-28 DIAGNOSIS — N281 Cyst of kidney, acquired: Secondary | ICD-10-CM | POA: Insufficient documentation

## 2021-09-01 ENCOUNTER — Telehealth: Payer: Self-pay | Admitting: *Deleted

## 2021-09-01 NOTE — Telephone Encounter (Signed)
Notified patient as instructed, patient pleased °

## 2021-09-01 NOTE — Telephone Encounter (Signed)
-----   Message from Alfredo Martinez, MD sent at 08/31/2021  2:04 PM EDT ----- ?Please call patient and tell her the ultrasound demonstrated a cyst which was within normal limits ?----- Message ----- ?From: Levada Schilling, CMA ?Sent: 08/31/2021   8:14 AM EDT ?To: Alfredo Martinez, MD ? ? ?----- Message ----- ?From: Interface, Rad Results In ?Sent: 08/30/2021  11:16 PM EDT ?To: [facility] ? ? ? ?

## 2021-09-07 ENCOUNTER — Ambulatory Visit: Payer: Medicare HMO | Admitting: Urology

## 2021-09-28 ENCOUNTER — Encounter: Payer: Self-pay | Admitting: Urology

## 2021-09-28 ENCOUNTER — Ambulatory Visit: Payer: Medicare HMO | Admitting: Urology

## 2021-09-28 VITALS — BP 158/77 | HR 54 | Wt 134.0 lb

## 2021-09-28 DIAGNOSIS — R350 Frequency of micturition: Secondary | ICD-10-CM

## 2021-09-28 LAB — URINALYSIS, COMPLETE
Bilirubin, UA: NEGATIVE
Glucose, UA: NEGATIVE
Ketones, UA: NEGATIVE
Leukocytes,UA: NEGATIVE
Nitrite, UA: NEGATIVE
Protein,UA: NEGATIVE
Specific Gravity, UA: 1.02 (ref 1.005–1.030)
Urobilinogen, Ur: 0.2 mg/dL (ref 0.2–1.0)
pH, UA: 7 (ref 5.0–7.5)

## 2021-09-28 LAB — MICROSCOPIC EXAMINATION
Bacteria, UA: NONE SEEN
Epithelial Cells (non renal): NONE SEEN /hpf (ref 0–10)

## 2021-09-28 NOTE — Progress Notes (Signed)
09/28/2021 10:55 AM   Soyla Murphy March 05, 1946 [id number]  Referring provider: Reubin Milan, MD [address] [place],  [place] [address]  Chief Complaint  Patient presents with   Cysto    HPI: Since January patient has been having likely urinary tract infections.  She was having discomfort in the suprapubic area back pain and frequency.  She took half a prescription due to nausea and followed up with 2 more prescriptions eventually recently in the emergency room.  She still has a suprapubic aching.  Her frequency has improved back to baseline every 2 hours.  She normally gets up sometimes once a night  She may have had prolapse surgery before.  She has had a hysterectomy.  At baseline she is continent   Patient had CT scan August 07, 2021.  She had normal kidneys except a small indeterminate right renal lesion incompletely characterized without contrast.  It was an exophytic nodule projecting posteriorly.  She has some gas in the bladder.  Renal ultrasound was recommended.    Pathophysiology of recurrent bladder infections discussed.  Urine sent for culture but looked normal today.  Hopefully her discomfort will down regulate on Keflex 250 mg daily 30x11.  It appears she may have had nausea from the sulfa drug and Macrodantin.  I will perform cystoscopy in the next 6 weeks.  I will call if the ultrasound is abnormal.  Her last urine culture was positive and one of the recent culture was negative.  Role of probiotics discussed  Today Frequency stable.  Ultrasound normal.  She had a small cyst in the right kidney lower pole 1 cm in size corresponding to the CT finding . last culture negative.  Well supported bladder neck and no prolapse with mild atrophy  Cystoscopy patient underwent flexible cystoscopy.  Bladder mucosa and trigone were normal.  No cystitis.  No carcinoma.  No pain with bladder filling.      PMH: Past Medical History:  Diagnosis Date    Hyperlipidemia     Surgical History: Past Surgical History:  Procedure Laterality Date   ABDOMINAL HYSTERECTOMY  1980   colon reconstruction  1980   RECONSTRUCTION URETHROPLASTY  1980    Home Medications:  Allergies as of 09/28/2021       Reactions   Macrobid [nitrofurantoin] Nausea Only   Atorvastatin    Ceftin [cefuroxime]    Sulfa Antibiotics Hives        Medication List        Accurate as of Sep 28, 2021 10:55 AM. If you have any questions, ask your nurse or doctor.          acetaminophen 500 MG tablet Commonly known as: TYLENOL Take 500 mg by mouth every 6 (six) hours as needed.   BIOTIN 5000 PO Take by mouth.   cephALEXin 250 MG capsule Commonly known as: Keflex Take 1 capsule (250 mg total) by mouth daily.   folic acid 1 MG tablet Commonly known as: FOLVITE Take 1 mg by mouth daily.   multivitamin tablet Take 1 tablet by mouth daily.   PROBIOTIC-10 PO Take by mouth as needed.   VITAMIN B-12 PO Take by mouth daily.   Vitamin D3 125 MCG (5000 UT) Caps Take by mouth.        Allergies:  Allergies  Allergen Reactions   Macrobid [Nitrofurantoin] Nausea Only   Atorvastatin    Ceftin [Cefuroxime]    Sulfa Antibiotics Hives    Family History: Family History  Problem Relation Age of Onset   Hypertension Mother    Cancer Father     Social History:  reports that she has never smoked. She has never used smokeless tobacco. She reports current alcohol use of about 4.0 standard drinks per week. She reports that she does not use drugs.  ROS:                                        Physical Exam: BP (!) 158/77   Pulse (!) 54   Wt 60.8 kg   BMI 24.51 kg/m     Laboratory Data: Lab Results  Component Value Date   WBC 4.9 06/18/2021   HGB 13.1 06/18/2021   HCT 40.3 06/18/2021   MCV 88 06/18/2021   PLT 194 06/18/2021    Lab Results  Component Value Date   CREATININE 1.00 06/18/2021    No results found  for: PSA  No results found for: TESTOSTERONE  No results found for: HGBA1C  Urinalysis    Component Value Date/Time   COLORURINE YELLOW (A) 08/07/2021 0731   APPEARANCEUR Clear 08/17/2021 1020   LABSPEC 1.008 08/07/2021 0731   PHURINE 6.0 08/07/2021 0731   GLUCOSEU Negative 08/17/2021 1020   HGBUR MODERATE (A) 08/07/2021 0731   BILIRUBINUR Negative 08/17/2021 1020   KETONESUR 5 (A) 08/07/2021 0731   PROTEINUR Negative 08/17/2021 1020   PROTEINUR NEGATIVE 08/07/2021 0731   UROBILINOGEN 0.2 07/28/2021 1338   NITRITE Negative 08/17/2021 1020   NITRITE NEGATIVE 08/07/2021 0731   LEUKOCYTESUR Negative 08/17/2021 1020   LEUKOCYTESUR LARGE (A) 08/07/2021 0731    Pertinent Imaging:   Assessment & Plan: Patient has intermittent suprapubic cramps.  She says they are better on Keflex but still come and go.  She understands they may down regulate more if they are due to past history of UTIs.  Otherwise she has been cleared from a genitourinary cause for abdominal pain.  She has had objective urinary tract infections and I will reassess in 4 months on Keflex.  Urine was normal today  1. Urine frequency  - Urinalysis, Complete   No follow-ups on file.  Martina Sinner, MD  [facility] [address] [place], [place] [address] [phone]

## 2021-11-24 ENCOUNTER — Encounter: Payer: Self-pay | Admitting: Internal Medicine

## 2021-11-24 ENCOUNTER — Ambulatory Visit (INDEPENDENT_AMBULATORY_CARE_PROVIDER_SITE_OTHER): Payer: Medicare HMO | Admitting: Internal Medicine

## 2021-11-24 VITALS — BP 138/78 | HR 52 | Ht 62.0 in | Wt 139.0 lb

## 2021-11-24 DIAGNOSIS — R030 Elevated blood-pressure reading, without diagnosis of hypertension: Secondary | ICD-10-CM | POA: Diagnosis not present

## 2021-11-24 DIAGNOSIS — N390 Urinary tract infection, site not specified: Secondary | ICD-10-CM

## 2021-11-24 DIAGNOSIS — E782 Mixed hyperlipidemia: Secondary | ICD-10-CM

## 2021-11-24 NOTE — Progress Notes (Signed)
Date:  11/24/2021   Name:  April Russell   DOB:  29-Mar-1946   MRN:  [id number]   Chief Complaint: Hyperlipidemia and Hypertension  Hyperlipidemia This is a chronic problem. The current episode started more than 1 year ago. Condition status: medications started last visit. Pertinent negatives include no chest pain or shortness of breath. Current antihyperlipidemic treatment includes diet change (she chose not to take the medication - instead changed to a vegetarian diet). There are no compliance problems.     Lab Results  Component Value Date   NA 139 06/18/2021   K 4.5 06/18/2021   CO2 22 06/18/2021   GLUCOSE 89 06/18/2021   BUN 10 06/18/2021   CREATININE 1.00 06/18/2021   CALCIUM 9.2 06/18/2021   EGFR 59 (L) 06/18/2021   GFRNONAA 63 02/20/2020   Lab Results  Component Value Date   CHOL 194 06/18/2021   HDL 52 06/18/2021   LDLCALC 115 (H) 06/18/2021   TRIG 152 (H) 06/18/2021   CHOLHDL 3.7 06/18/2021   Lab Results  Component Value Date   TSH 2.110 06/18/2021   No results found for: "HGBA1C" Lab Results  Component Value Date   WBC 4.9 06/18/2021   HGB 13.1 06/18/2021   HCT 40.3 06/18/2021   MCV 88 06/18/2021   PLT 194 06/18/2021   Lab Results  Component Value Date   ALT 12 06/18/2021   AST 23 06/18/2021   ALKPHOS 66 06/18/2021   BILITOT 0.5 06/18/2021   No results found for: "25OHVITD2", "25OHVITD3", "VD25OH"   Review of Systems  Constitutional:  Negative for fatigue and unexpected weight change.  HENT:  Negative for nosebleeds.   Eyes:  Negative for visual disturbance.  Respiratory:  Negative for cough, chest tightness, shortness of breath and wheezing.   Cardiovascular:  Negative for chest pain, palpitations and leg swelling.  Gastrointestinal:  Negative for abdominal pain, constipation and diarrhea.  Neurological:  Negative for dizziness, weakness, light-headedness and headaches.    Patient Active Problem List   Diagnosis Date Noted   Recurrent UTI  11/24/2021   Mixed hyperlipidemia 06/19/2021   Varicose veins of both lower extremities with pain 01/23/2021   Osteopenia determined by x-ray 01/23/2021   Elevated blood pressure reading 01/23/2021   Seasonal allergies 02/20/2020   Skin lesion 02/20/2020    Allergies  Allergen Reactions   Macrobid [Nitrofurantoin] Nausea Only   Atorvastatin    Ceftin [Cefuroxime]    Sulfa Antibiotics Hives    Past Surgical History:  Procedure Laterality Date   ABDOMINAL HYSTERECTOMY  1980   colon reconstruction  1980   RECONSTRUCTION URETHROPLASTY  1980    Social History   Tobacco Use   Smoking status: Never   Smokeless tobacco: Never  Vaping Use   Vaping Use: Never used  Substance Use Topics   Alcohol use: Yes    Alcohol/week: 4.0 standard drinks of alcohol    Types: 4 Glasses of wine per week    Comment: occass   Drug use: Never     Medication list has been reviewed and updated.  Current Meds  Medication Sig   acetaminophen (TYLENOL) 500 MG tablet Take 500 mg by mouth every 6 (six) hours as needed.   BIOTIN 5000 PO Take by mouth.   cephALEXin (KEFLEX) 250 MG capsule Take 1 capsule (250 mg total) by mouth daily.   Cholecalciferol (VITAMIN D3) 125 MCG (5000 UT) CAPS Take by mouth.   COLLAGEN PO Take by mouth 3 (three) times a  week.   Cyanocobalamin (VITAMIN B-12 PO) Take by mouth daily.   folic acid (FOLVITE) 1 MG tablet Take 1 mg by mouth daily.   Multiple Vitamin (MULTIVITAMIN) tablet Take 1 tablet by mouth daily.   Probiotic Product (PROBIOTIC-10 PO) Take by mouth as needed.       11/24/2021    9:42 AM 07/28/2021    1:33 PM 07/13/2021    2:40 PM 06/18/2021    8:43 AM  GAD 7 : Generalized Anxiety Score  Nervous, Anxious, on Edge 0 0 1 1  Control/stop worrying 0 0 1 1  Worry too much - different things 0 _0 Trouble relaxing 0 _1 Restless 0 _2 Easily annoyed or irritable 0 0 0 0  Afraid - awful might happen 0 0 0 0  Total GAD 7 Score 0 _3 Anxiety  Difficulty Not difficult at all Somewhat difficult  Somewhat difficult       11/24/2021    9:41 AM 07/28/2021    1:32 PM 07/13/2021    2:40 PM  Depression screen PHQ 2/9  Decreased Interest 0 0 0  Down, Depressed, Hopeless 0 1 0  PHQ - 2 Score 0 1 0  Altered sleeping 0 1 1  Tired, decreased energy 0 0 0  Change in appetite 0 1 0  Feeling bad or failure about yourself  0 0 0  Trouble concentrating 0 1 1  Moving slowly or fidgety/restless 0 0 0  Suicidal thoughts 0 0 0  PHQ-9 Score 0 4 2  Difficult doing work/chores Not difficult at all Not difficult at all Not difficult at all    BP Readings from Last 3 Encounters:  11/24/21 138/78  09/28/21 (!) 158/77  08/17/21 (!) 179/102    Physical Exam Vitals and nursing note reviewed.  Constitutional:      General: She is not in acute distress.    Appearance: She is well-developed.  HENT:     Head: Normocephalic and atraumatic.  Cardiovascular:     Rate and Rhythm: Normal rate and regular rhythm.     Heart sounds: No murmur heard. Pulmonary:     Effort: Pulmonary effort is normal. No respiratory distress.     Breath sounds: No wheezing or rhonchi.  Musculoskeletal:     Right lower leg: No edema.     Left lower leg: No edema.  Skin:    General: Skin is warm and dry.     Findings: No rash.  Neurological:     Mental Status: She is alert and oriented to person, place, and time.  Psychiatric:        Mood and Affect: Mood normal.        Behavior: Behavior normal.     Wt Readings from Last 3 Encounters:  11/24/21 139 lb (63 kg)  09/28/21 134 lb (60.8 kg)  08/17/21 134 lb (60.8 kg)    BP 138/78 (BP Location: Right Arm, Cuff Size: Normal)   Pulse (!) 52   Ht _4  (1.575 m)   Wt 139 lb (63 kg)   SpO2 98%   BMI 25.42 kg/m   Assessment and Plan: 1. Mixed hyperlipidemia 10 yr risk elevated but pt declined statin therapy Making diet changes instead - Lipid panel  2. Recurrent UTI On Keflex for suppression Still has  bladder aching off and on.  3. Elevated blood pressure reading Normal at home per patient. It improved with sitting for  a few minutes - recommend bringing home cuff to next visit for correlation - Basic metabolic panel   Partially dictated using Editor, commissioning. Any errors are unintentional.  Halina Maidens, MD [facility]  11/24/2021

## 2021-11-25 LAB — BASIC METABOLIC PANEL
BUN/Creatinine Ratio: 10 — ABNORMAL LOW (ref 12–28)
BUN: 9 mg/dL (ref 8–27)
CO2: 23 mmol/L (ref 20–29)
Calcium: 9 mg/dL (ref 8.7–10.3)
Chloride: 103 mmol/L (ref 96–106)
Creatinine, Ser: 0.93 mg/dL (ref 0.57–1.00)
Glucose: 89 mg/dL (ref 70–99)
Potassium: 4.3 mmol/L (ref 3.5–5.2)
Sodium: 140 mmol/L (ref 134–144)
eGFR: 64 mL/min/{1.73_m2} (ref >59)

## 2021-11-25 LAB — LIPID PANEL
Chol/HDL Ratio: 3 ratio (ref 0.0–4.4)
Cholesterol, Total: 152 mg/dL (ref 100–199)
HDL: 51 mg/dL (ref >39)
LDL Chol Calc (NIH): 68 mg/dL (ref 0–99)
Triglycerides: 201 mg/dL — ABNORMAL HIGH (ref 0–149)
VLDL Cholesterol Cal: 33 mg/dL (ref 5–40)

## 2021-12-09 ENCOUNTER — Ambulatory Visit (INDEPENDENT_AMBULATORY_CARE_PROVIDER_SITE_OTHER): Payer: Medicare HMO

## 2021-12-09 DIAGNOSIS — Z0000 Encounter for general adult medical examination without abnormal findings: Secondary | ICD-10-CM | POA: Diagnosis not present

## 2021-12-09 NOTE — Progress Notes (Signed)
I connected with  Soyla Murphy on 12/09/21 by a audio enabled telemedicine application and verified that I am speaking with the correct person using two identifiers.  Patient Location: Home  Provider Location: Home Office  I discussed the limitations of evaluation and management by telemedicine. The patient expressed understanding and agreed to proceed.   Subjective:   April Russell is a 76 y.o. female who presents for Medicare Annual (Subsequent) preventive examination.  Review of Systems    Per HPI unless specifically indicated below Cardiac Risk Factors include: advanced age (>85men, >28 women);female gender          Objective:       11/24/2021   10:07 AM 11/24/2021    9:33 AM 09/28/2021   10:54 AM  Vitals with BMI  Height  5\' 2"    Weight  139 lbs 134 lbs  BMI  25.42   Systolic 138 160  Diastolic 78 84 77  Pulse  52 54    There were no vitals filed for this visit. There is no height or weight on file to calculate BMI.     08/07/2021    7:34 AM 06/29/2021    1:47 PM  Advanced Directives  Does Patient Have a Medical Advance Directive? No No  Would patient like information on creating a medical advance directive?  Yes (MAU/Ambulatory/Procedural Areas - Information given)    Current Medications (verified) Outpatient Encounter Medications as of 12/09/2021  Medication Sig   acetaminophen (TYLENOL) 500 MG tablet Take 500 mg by mouth every 6 (six) hours as needed.   BIOTIN 5000 PO Take by mouth.   cephALEXin (KEFLEX) 250 MG capsule Take 1 capsule (250 mg total) by mouth daily.   Cholecalciferol (VITAMIN D3) 125 MCG (5000 UT) CAPS Take by mouth.   COLLAGEN PO Take by mouth 3 (three) times a week.   Cyanocobalamin (VITAMIN B-12 PO) Take by mouth daily.   folic acid (FOLVITE) 1 MG tablet Take 1 mg by mouth daily.   Multiple Vitamin (MULTIVITAMIN) tablet Take 1 tablet by mouth daily.   Probiotic Product (PROBIOTIC-10 PO) Take by mouth as needed.   No  facility-administered encounter medications on file as of 12/09/2021.    Allergies (verified) Macrobid [nitrofurantoin], Atorvastatin, Ceftin [cefuroxime], and Sulfa antibiotics   History: Past Medical History:  Diagnosis Date   Hyperlipidemia    Past Surgical History:  Procedure Laterality Date   ABDOMINAL HYSTERECTOMY  1980   colon reconstruction  1980   RECONSTRUCTION URETHROPLASTY  1980   Family History  Problem Relation Age of Onset   Hypertension Mother    Cancer Father    Social History   Socioeconomic History   Marital status: Widowed    Spouse name: Not on file   Number of children: 4   Years of education: Not on file   Highest education level: Not on file  Occupational History   Occupation: Housekeeper  Tobacco Use   Smoking status: Never   Smokeless tobacco: Never  Vaping Use   Vaping Use: Never used  Substance and Sexual Activity   Alcohol use: Yes    Alcohol/week: 4.0 standard drinks of alcohol    Types: 4 Glasses of wine per week    Comment: occassional   Drug use: Never   Sexual activity: Not Currently  Other Topics Concern   Not on file  Social History Narrative   Pt lives with her son who is legally blind   Social Determinants of Health  Financial Resource Strain: Low Risk  (12/09/2021)   Overall Financial Resource Strain (CARDIA)    Difficulty of Paying Living Expenses: Not hard at all  Food Insecurity: No Food Insecurity (12/09/2021)   Hunger Vital Sign    Worried About Running Out of Food in the Last Year: Never true    Ran Out of Food in the Last Year: Never true  Transportation Needs: No Transportation Needs (12/09/2021)   PRAPARE - Administrator, Civil Service (Medical): No    Lack of Transportation (Non-Medical): No  Physical Activity: Insufficiently Active (12/09/2021)   Exercise Vital Sign    Days of Exercise per Week: 2 days    Minutes of Exercise per Session: 50 min  Stress: Stress Concern Present (12/09/2021)   [employer] of [employer]ational Health - Occupational Stress Questionnaire    Feeling of Stress : To some extent  Social Connections: Moderately Integrated (12/09/2021)   Social Connection and Isolation Panel [NHANES]    Frequency of Communication with Friends and Family: More than three times a week    Frequency of Social Gatherings with Friends and Family: More than three times a week    Attends Religious Services: More than 4 times per year    Active Member of [employer] or Organizations: Yes    Attends Banker Meetings: More than 4 times per year    Marital Status: Widowed    Tobacco Counseling Counseling given: Not Answered   Clinical Intake:  Pre-visit preparation completed: No  Pain : No/denies pain     Nutritional Status: BMI of 19-24  Normal Nutritional Risks: None Diabetes: No  How often do you need to have someone help you when you read instructions, pamphlets, or other written materials from your doctor or pharmacy?: 1 - Never  Diabetic?no  Interpreter Needed?: No  Information entered by :: Gerre Couch, CMA   Activities of Daily Living    12/09/2021    1:58 PM 07/28/2021    1:33 PM  In your present state of health, do you have any difficulty performing the following activities:  Hearing? 0 0  Vision? 1 0  Difficulty concentrating or making decisions? 0 0  Walking or climbing stairs? 0 0  Dressing or bathing? 0 0  Doing errands, shopping? 0 0    Patient Care Team: Reubin Milan, MD as PCP - General (Internal Medicine)  Indicate any recent Medical Services you may have received from other than [facility] providers in the past year (date may be approximate).    The pt was seen at [facility] on 08/07/2021 for an Urinary tract Infection w/ hematuria.   Assessment:   This is a routine wellness examination for [place].  Hearing/Vision screen Denies any hearing issues. Annual eye exam with [facility]. Admits she has blurry vision and that her most  recent eye glass prescription seemed incorrect. She declined following bsck up with [facility]. Her plan is to contact her insurance to find out her preferred opthalmology.  Dietary issues and exercise activities discussed: Current Exercise Habits: Structured exercise class, Type of exercise: walking, Time (Minutes): 40, Frequency (Times/Week): 2, Weekly Exercise (Minutes/Week): 80, Intensity: Mild   Goals Addressed             This Visit's Progress    Activity and Exercise Increased       Evidence-based guidance:  Review current exercise levels.  Assess patient perspective on exercise or activity level, barriers to increasing activity, motivation and  readiness for change.  Recommend or set healthy exercise goal based on individual tolerance.  Encourage small steps toward making change in amount of exercise or activity.  Urge reduction of sedentary activities or screen time.  Promote group activities within the community or with family or support person.  Consider referral to rehabiliation therapist for assessment and exercise/activity plan.   Notes:        Depression Screen    12/09/2021    1:41 PM 11/24/2021    9:41 AM 07/28/2021    1:32 PM 07/13/2021    2:40 PM 06/29/2021    1:41 PM 06/18/2021    8:43 AM 01/23/2021    2:06 PM  PHQ 2/9 Scores  PHQ - 2 Score 0 0 1 0 1 1 2   PHQ- 9 Score 2 0 4 2 3 4 8     Fall Risk    12/09/2021    1:59 PM 11/24/2021    9:42 AM 07/28/2021    1:33 PM 07/13/2021    2:41 PM 06/29/2021    1:51 PM  Fall Risk   Falls in the past year? 0 0 0 0 0  Number falls in past yr: 0 0 0 0 0  Injury with Fall? 0 0 0 0 0  Risk for fall due to : No Fall Risks No Fall Risks No Fall Risks No Fall Risks No Fall Risks  Follow up Falls evaluation completed Falls evaluation completed Falls evaluation completed Falls evaluation completed Falls prevention discussed    FALL RISK PREVENTION PERTAINING TO THE HOME:  Any stairs in or around the home? Yes  If so, are  there any without handrails? No  Home free of loose throw rugs in walkways, pet beds, electrical cords, etc? Yes  Adequate lighting in your home to reduce risk of falls? Yes   ASSISTIVE DEVICES UTILIZED TO PREVENT FALLS:  Life alert? No  Use of a cane, walker or w/c? No  Grab bars in the bathroom? Yes  Shower chair or bench in shower? Yes  Elevated toilet seat or a handicapped toilet? No   Cognitive Function:        12/09/2021    1:58 PM  6CIT Screen  What Year? 0 points  What month? 0 points  What time? 0 points  Count back from 20 0 points  Months in reverse 0 points  Repeat phrase 0 points  Total Score 0 points    Immunizations Immunization History  Administered Date(s) Administered   Tdap 07/28/2021    TDAP status: Up to date  Flu Vaccine status: Up to date  Pneumococcal vaccine status: Due, Education has been provided regarding the importance of this vaccine. Advised may receive this vaccine at local pharmacy or Health Dept. Aware to provide a copy of the vaccination record if obtained from local pharmacy or Health Dept. Verbalized acceptance and understanding.  Covid-19 vaccine status: Declined, Education has been provided regarding the importance of this vaccine but patient still declined. Advised may receive this vaccine at local pharmacy or Health Dept.or vaccine clinic. Aware to provide a copy of the vaccination record if obtained from local pharmacy or Health Dept. Verbalized acceptance and understanding.  Qualifies for Shingles Vaccine? Yes   Zostavax completed No   Shingrix Completed?: No.    Education has been provided regarding the importance of this vaccine. Patient has been advised to call insurance company to determine out of pocket expense if they have not yet received this vaccine. Advised may also receive  vaccine at local pharmacy or Health Dept. Verbalized acceptance and understanding. Pt declined shingrix vaccine. She said she was told since she never  had chicken pox she should never get the shingrix vaccine.   Screening Tests Health Maintenance  Topic Date Due   Zoster Vaccines- Shingrix (1 of 2) Never done   Pneumonia Vaccine 31+ Years old (1 - PCV) Never done   INFLUENZA VACCINE  12/08/2021   COVID-19 Vaccine (1) 12/10/2021 (Originally 05/31/1946)   MAMMOGRAM  07/23/2022   COLONOSCOPY (Pts 45-15yrs Insurance coverage will need to be confirmed)  02/16/2028   TETANUS/TDAP  07/29/2031   DEXA SCAN  Completed   Hepatitis C Screening  Completed   HPV VACCINES  Aged Out    Health Maintenance  Health Maintenance Due  Topic Date Due   Zoster Vaccines- Shingrix (1 of 2) Never done   Pneumonia Vaccine 28+ Years old (1 - PCV) Never done   INFLUENZA VACCINE  12/08/2021    Colorectal cancer screening: Type of screening: Colonoscopy. Completed 02/15/2018. Repeat every 10 years  Mammogram status: Completed 07/22/2021. Repeat every year  DEXA Scan: 06/24/2020  Lung Cancer Screening: (Low Dose CT Chest recommended if Age 48-80 years, 30 pack-year currently smoking OR have quit w/in 15years.) does not qualify.   Lung Cancer Screening Referral: does not qualify   Additional Screening:  Hepatitis C Screening: does qualify; Completed 06/18/2021  Vision Screening: Recommended annual ophthalmology exams for early detection of glaucoma and other disorders of the eye. Is the patient up to date with their annual eye exam?  Yes  Who is the provider or what is the name of the office in which the patient attends annual eye exams? [facility]  If pt is not established with a provider, would they like to be referred to a provider to establish care? No .   Dental Screening: Recommended annual dental exams for proper oral hygiene  Community Resource Referral / Chronic Care Management: CRR required this visit?  No   CCM required this visit?  No      Plan:     I have personally reviewed and noted the following in the patient's  chart:   Medical and social history Use of alcohol, tobacco or illicit drugs  Current medications and supplements including opioid prescriptions.  Functional ability and status Nutritional status Physical activity Advanced directives List of other physicians Hospitalizations, surgeries, and ER visits in previous 12 months Vitals Screenings to include cognitive, depression, and falls Referrals and appointments  In addition, I have reviewed and discussed with patient certain preventive protocols, quality metrics, and best practice recommendations. A written personalized care plan for preventive services as well as general preventive health recommendations were provided to patient.   Ms. Anglada , Thank you for taking time to come for your Medicare Wellness Visit. I appreciate your ongoing commitment to your health goals. Please review the following plan we discussed and let me know if I can assist you in the future.   These are the goals we discussed:  Goals      Activity and Exercise Increased     Evidence-based guidance:  Review current exercise levels.  Assess patient perspective on exercise or activity level, barriers to increasing activity, motivation and readiness for change.  Recommend or set healthy exercise goal based on individual tolerance.  Encourage small steps toward making change in amount of exercise or activity.  Urge reduction of sedentary activities or screen time.  Promote group activities within the  community or with family or support person.  Consider referral to rehabiliation therapist for assessment and exercise/activity plan.   Notes:      Patient Stated     Patient states she would like to lower her cholesterol with healthy eating and physical activity         This is a list of the screening recommended for you and due dates:  Health Maintenance  Topic Date Due   Zoster (Shingles) Vaccine (1 of 2) Never done   Pneumonia Vaccine (1 - PCV) Never done    Flu Shot  12/08/2021   COVID-19 Vaccine (1) 12/10/2021*   Mammogram  07/23/2022   Colon Cancer Screening  02/16/2028   Tetanus Vaccine  07/29/2031   DEXA scan (bone density measurement)  Completed   Hepatitis C Screening: USPSTF Recommendation to screen - Ages 60-79 yo.  Completed   HPV Vaccine  Aged Out  *Topic was postponed. The date shown is not the original due date.      Lonna Cobb, CMA   12/09/2021   Nurse Notes: 40 minute Non Face to face visit

## 2021-12-09 NOTE — Patient Instructions (Signed)

## 2022-02-01 ENCOUNTER — Ambulatory Visit: Payer: Medicare HMO | Admitting: Urology

## 2022-02-01 ENCOUNTER — Encounter: Payer: Self-pay | Admitting: Urology

## 2022-02-01 VITALS — BP 164/79 | HR 57 | Ht 62.0 in | Wt 139.0 lb

## 2022-02-01 DIAGNOSIS — R350 Frequency of micturition: Secondary | ICD-10-CM

## 2022-02-01 LAB — URINALYSIS, COMPLETE
Bilirubin, UA: NEGATIVE
Glucose, UA: NEGATIVE
Ketones, UA: NEGATIVE
Leukocytes,UA: NEGATIVE
Nitrite, UA: NEGATIVE
Protein,UA: NEGATIVE
Specific Gravity, UA: 1.015 (ref 1.005–1.030)
Urobilinogen, Ur: 0.2 mg/dL (ref 0.2–1.0)
pH, UA: 7 (ref 5.0–7.5)

## 2022-02-01 LAB — MICROSCOPIC EXAMINATION

## 2022-02-01 NOTE — Progress Notes (Signed)
02/01/2022 10:44 AM   April Russell 08/31/1945 [id number]  Referring provider: Reubin Milan, MD [address] [place],  [place] [address]  Chief Complaint  Patient presents with   Follow-up    follow-up urine frequency     HPI: Since January patient has been having likely urinary tract infections.  She was having discomfort in the suprapubic area back pain and frequency.  She took half a prescription due to nausea and followed up with 2 more prescriptions eventually recently in the emergency room.  She still has a suprapubic aching.  Her frequency has improved back to baseline every 2 hours.  She normally gets up sometimes once a night  She may have had prolapse surgery before.  She has had a hysterectomy.  At baseline she is continent   Patient had CT scan August 07, 2021.  She had normal kidneys except a small indeterminate right renal lesion incompletely characterized without contrast.  It was an exophytic nodule projecting posteriorly.  She has some gas in the bladder.  Renal ultrasound was recommended.     Pathophysiology of recurrent bladder infections discussed.  Urine sent for culture but looked normal today.  Hopefully her discomfort will down regulate on Keflex 250 mg daily 30x11.  It appears she may have had nausea from the sulfa drug and Macrodantin.  Her last urine culture was positive and one of the recent culture was negative.  Role of probiotics discussed   Ultrasound normal.  She had a small cyst in the right kidney lower pole 1 cm in size corresponding to the CT finding . last culture negative.   Well supported bladder neck and no prolapse with mild atrophy  Cystoscopy normal  Patient has intermittent suprapubic cramps.  She says they are better on Keflex but still come and go.  She understands they may down regulate more if they are due to past history of UTIs.  Otherwise she has been cleared from a genitourinary cause for abdominal pain.  She  has had objective urinary tract infections and I will reassess in 4 months on Keflex.  Urine was normal today  Today Frequency stable stable.  Once again she gets some nonspecific twinges at times which I think are unrelated.  Clinically not infected.         PMH: Past Medical History:  Diagnosis Date   Hyperlipidemia     Surgical History: Past Surgical History:  Procedure Laterality Date   ABDOMINAL HYSTERECTOMY  1980   colon reconstruction  1980   RECONSTRUCTION URETHROPLASTY  1980    Home Medications:  Allergies as of 02/01/2022       Reactions   Macrobid [nitrofurantoin] Nausea Only   Atorvastatin    Ceftin [cefuroxime]    Sulfa Antibiotics Hives        Medication List        Accurate as of February 01, 2022 10:44 AM. If you have any questions, ask your nurse or doctor.          acetaminophen 500 MG tablet Commonly known as: TYLENOL Take 500 mg by mouth every 6 (six) hours as needed.   BIOTIN 5000 PO Take by mouth.   cephALEXin 250 MG capsule Commonly known as: Keflex Take 1 capsule (250 mg total) by mouth daily.   COLLAGEN PO Take by mouth 3 (three) times a week.   folic acid 1 MG tablet Commonly known as: FOLVITE Take 1 mg by mouth daily.   multivitamin tablet  Take 1 tablet by mouth daily.   PROBIOTIC-10 PO Take by mouth as needed.   VITAMIN B-12 PO Take by mouth daily.   Vitamin D3 125 MCG (5000 UT) Caps Take by mouth.        Allergies:  Allergies  Allergen Reactions   Macrobid [Nitrofurantoin] Nausea Only   Atorvastatin    Ceftin [Cefuroxime]    Sulfa Antibiotics Hives    Family History: Family History  Problem Relation Age of Onset   Hypertension Mother    Cancer Father     Social History:  reports that she has never smoked. She has never been exposed to tobacco smoke. She has never used smokeless tobacco. She reports current alcohol use of about 4.0 standard drinks of alcohol per week. She reports that she does  not use drugs.  ROS:                                        Physical Exam: BP (!) 164/79   Pulse (!) 57   Ht 5\' 2"  (1.575 m)   Wt 63 kg   BMI 25.42 kg/m   Constitutional:  Alert and oriented, No acute distress. HEENT: [place] AT, moist mucus membranes.  Trachea midline, no masses.   Laboratory Data: Lab Results  Component Value Date   WBC 4.9 06/18/2021   HGB 13.1 06/18/2021   HCT 40.3 06/18/2021   MCV 88 06/18/2021   PLT 194 06/18/2021    Lab Results  Component Value Date   CREATININE 0.93 11/24/2021    No results found for: "PSA"  No results found for: "TESTOSTERONE"  No results found for: "HGBA1C"  Urinalysis    Component Value Date/Time   COLORURINE YELLOW (A) 08/07/2021 0731   APPEARANCEUR Clear 09/28/2021 1034   LABSPEC 1.008 08/07/2021 0731   PHURINE 6.0 08/07/2021 0731   GLUCOSEU Negative 09/28/2021 1034   HGBUR MODERATE (A) 08/07/2021 0731   BILIRUBINUR Negative 09/28/2021 1034   KETONESUR 5 (A) 08/07/2021 0731   PROTEINUR Negative 09/28/2021 1034   PROTEINUR NEGATIVE 08/07/2021 0731   UROBILINOGEN 0.2 07/28/2021 1338   NITRITE Negative 09/28/2021 1034   NITRITE NEGATIVE 08/07/2021 0731   LEUKOCYTESUR Negative 09/28/2021 1034   LEUKOCYTESUR LARGE (A) 08/07/2021 0731    Pertinent Imaging:   Assessment & Plan: Keflex 250 mg daily 90x3 sent to pharmacy.  Call if culture positive.  See in 1 year  1. Urine frequency  - Urinalysis, Complete   No follow-ups on file.  Reece Packer, MD  [facility] [address] [place], [place] [address] [phone]

## 2022-02-04 LAB — CULTURE, URINE COMPREHENSIVE

## 2022-06-21 ENCOUNTER — Encounter: Payer: Medicare HMO | Admitting: Internal Medicine

## 2022-06-30 ENCOUNTER — Ambulatory Visit: Payer: Medicare HMO

## 2022-08-15 ENCOUNTER — Other Ambulatory Visit: Payer: Self-pay

## 2022-08-15 ENCOUNTER — Emergency Department: Payer: Medicare HMO

## 2022-08-15 ENCOUNTER — Emergency Department
Admission: EM | Admit: 2022-08-15 | Discharge: 2022-08-15 | Disposition: A | Discharge status: Home / Self Care | Payer: Medicare HMO | Attending: Emergency Medicine | Admitting: Emergency Medicine

## 2022-08-15 DIAGNOSIS — R519 Headache, unspecified: Secondary | ICD-10-CM | POA: Diagnosis not present

## 2022-08-15 DIAGNOSIS — Z79899 Other long term (current) drug therapy: Secondary | ICD-10-CM | POA: Diagnosis not present

## 2022-08-15 DIAGNOSIS — I10 Essential (primary) hypertension: Secondary | ICD-10-CM | POA: Diagnosis not present

## 2022-08-15 HISTORY — DX: Urinary tract infection, site not specified: N39.0

## 2022-08-15 LAB — BASIC METABOLIC PANEL
Anion gap: 8 (ref 5–15)
BUN: 9 mg/dL (ref 8–23)
CO2: 25 mmol/L (ref 22–32)
Calcium: 9.1 mg/dL (ref 8.9–10.3)
Chloride: 104 mmol/L (ref 98–111)
Creatinine, Ser: 0.85 mg/dL (ref 0.44–1.00)
GFR, Estimated: >60 mL/min (ref >60)
Glucose, Bld: 97 mg/dL (ref 70–99)
Potassium: 3.9 mmol/L (ref 3.5–5.1)
Sodium: 137 mmol/L (ref 135–145)

## 2022-08-15 LAB — CBC WITH DIFFERENTIAL/PLATELET
Abs Immature Granulocytes: 0.01 10*3/uL (ref 0.00–0.07)
Basophils Absolute: 0 10*3/uL (ref 0.0–0.1)
Basophils Relative: 1 %
Eosinophils Absolute: 0 10*3/uL (ref 0.0–0.5)
Eosinophils Relative: 1 %
HCT: 40.6 % (ref 36.0–46.0)
Hemoglobin: 13.7 g/dL (ref 12.0–15.0)
Immature Granulocytes: 0 %
Lymphocytes Relative: 37 %
Lymphs Abs: 1.2 10*3/uL (ref 0.7–4.0)
MCH: 30.1 pg (ref 26.0–34.0)
MCHC: 33.7 g/dL (ref 30.0–36.0)
MCV: 89.2 fL (ref 80.0–100.0)
Monocytes Absolute: 0.3 10*3/uL (ref 0.1–1.0)
Monocytes Relative: 10 %
Neutro Abs: 1.6 10*3/uL — ABNORMAL LOW (ref 1.7–7.7)
Neutrophils Relative %: 51 %
Platelets: 206 10*3/uL (ref 150–400)
RBC: 4.55 MIL/uL (ref 3.87–5.11)
RDW: 12.1 % (ref 11.5–15.5)
WBC: 3.2 10*3/uL — ABNORMAL LOW (ref 4.0–10.5)
nRBC: 0 % (ref 0.0–0.2)

## 2022-08-15 LAB — TROPONIN I (HIGH SENSITIVITY): Troponin I (High Sensitivity): 4 ng/L (ref <18)

## 2022-08-15 MED ORDER — AMLODIPINE BESYLATE 5 MG PO TABS: 5.0000 mg | ORAL_TABLET | Freq: Every day | ORAL | 0 refills | Status: AC

## 2022-08-15 NOTE — ED Provider Notes (Signed)
[facility] Provider Note    Event Date/Time   First MD Initiated Contact with Patient 08/15/22 1400     (approximate)   History   Hypertension   HPI  April Russell is a 77 y.o. female who presents today for evaluation of high blood pressure.  Patient reports that her blood pressure has been intermittently elevated for the past year.  She came in today because she had a headache and chest tightness last night, and still has a mild headache today.  She reports that she has had intermittently blurry vision.  She denies shortness of breath or nausea.  She reports that she saw her primary care doctor in July and the plan was to watch her blood pressure, and she is concerned that it remains elevated.  She has an appointment with a new primary care doctor but not until June and she is worried that this is too far away.  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   Osteopenia determined by x-ray 01/23/2021   Elevated blood pressure reading 01/23/2021   Seasonal allergies 02/20/2020   Skin lesion 02/20/2020          Physical Exam   Triage Vital Signs: ED Triage Vitals  Enc Vitals Group     BP 08/15/22 1342 (!) 166/80     Pulse Rate 08/15/22 1342 64     Resp 08/15/22 1342 17     Temp 08/15/22 1342 98.1 F (36.7 C)     Temp Source 08/15/22 1342 Oral     SpO2 08/15/22 1342 97 %     Weight 08/15/22 1343 135 lb (61.2 kg)     Height 08/15/22 1343 5\' 3"  (1.6 m)     Head Circumference --      Peak Flow --      Pain Score 08/15/22 1343 3     Pain Loc --      Pain Edu? --      Excl. in GC? --     Most recent vital signs: Vitals:   08/15/22 1342 08/15/22 1528  BP: (!) 166/80 (!) 173/71  Pulse: 64 66  Resp: 17 17  Temp: 98.1 F (36.7 C)   SpO2: 97% 97%    Physical Exam Vitals and nursing note reviewed.   Constitutional:      General: Awake and alert. No acute distress.    Appearance: Normal appearance. The patient is normal weight.  HENT:     Head: Normocephalic and atraumatic.     Mouth: Mucous membranes are moist.  Eyes:     General: PERRL. Normal EOMs        Right eye: No discharge.        Left eye: No discharge.     Conjunctiva/sclera: Conjunctivae normal.  Cardiovascular:     Rate and Rhythm: Normal rate and regular rhythm.     Pulses: Normal pulses.  Pulmonary:     Effort: Pulmonary effort is normal. No respiratory distress.     Breath sounds: Normal breath sounds.  Abdominal:     Abdomen is soft. There is no abdominal tenderness. No rebound or guarding. No distention. Musculoskeletal:        General: No swelling. Normal range of motion.     Cervical back: Normal range of motion and neck supple.  Skin:    General: Skin is  warm and dry.     Capillary Refill: Capillary refill takes less than 2 seconds.     Findings: No rash.  Neurological:     Mental Status: The patient is awake and alert.   Neurological: GCS 15 alert and oriented x3 Normal speech, no expressive or receptive aphasia or dysarthria Cranial nerves II through XII intact Normal visual fields 5 out of 5 strength in all 4 extremities with intact sensation throughout No extremity drift Normal finger-to-nose testing, no limb or truncal ataxia    ED Results / Procedures / Treatments   Labs (all labs ordered are listed, but only abnormal results are displayed) Labs Reviewed  CBC WITH DIFFERENTIAL/PLATELET - Abnormal; Notable for the following components:      Result Value   WBC 3.2 (*)    Neutro Abs 1.6 (*)    All other components within normal limits  BASIC METABOLIC PANEL  TROPONIN I (HIGH SENSITIVITY)  TROPONIN I (HIGH SENSITIVITY)     EKG     RADIOLOGY I independently reviewed and interpreted imaging and agree with radiologists findings.     PROCEDURES:  Critical Care performed:    Procedures   MEDICATIONS ORDERED IN ED: Medications - No data to display   IMPRESSION / MDM / ASSESSMENT AND PLAN / ED COURSE  I reviewed the triage vital signs and the nursing notes.   Differential diagnosis includes, but is not limited to, elevated blood pressure reading, hypertensive urgency, essential hypertension, hypertensive emergency, whitecoat hypertension.  I reviewed the patient's chart.  Patient has had labile blood pressures.  She most recently saw her primary care at her on 11/24/2021 at which time she had an elevated blood pressure reading she first presented to the clinic, and then upon repeat her blood pressure was 138/78.  Her PCP has had her check her temperature at home and the patient reports that it is normal.  Her blood pressure had improved after patient sat down for a few minutes.  Plan on continuing to watch her blood pressure for now.  Patient presents emergency department hypertensive to 166/80, though afebrile and nontoxic in appearance.  She has normal oxygen saturation of 97% on room air.  She agreed to further workup to evaluate for endorgan damage.  Labs are overall quite reassuring.  CT head obtained given her elevated blood pressure and her headaches and this is negative for any acute findings as well.  I discussed with patient the proper way to take her blood pressure at home, while she has been sitting for 30 minutes and comfortable and calm.  I expressed to her my concern for starting a blood pressure medication if her blood pressure is not normally elevated and is only elevated in the setting of being stressed or anxious, given that it could acutely lower her blood pressure too much and be harmful to her as well.  Instead, we discussed the importance of diet and exercise.  Patient is concerned about her elevated blood pressure readings for prolonged period of time.  She admits to not taking her blood pressure at home daily when she is feeling well.  Given  that her primary care appointment is still more than 2 months away, she would like blood pressure medication on hand.  We discussed that she should take her blood pressure daily when sitting down and feeling calm for more than 30 minutes, and if her blood pressures remain elevated then she may initiate the blood pressure medication.  We discussed side effects  and reasons to stop the blood pressure medication.  I recommended very close outpatient follow-up sooner than her appointment 2 months away for reevaluation.  She understands the risks of taking this medication, especially given that the emergency department has not the provider that is following her.  Patient feels comfortable initiating this medication and agrees to follow all directions provided in terms of monitoring her blood pressure at home and not taking the medication if she is not hypertensive or if she develops hypotension, dizziness, nausea, or other concerns.  She understands strict return precautions to the emergency department.  Her son is with her and also feels comfortable with this plan.  All questions were answered and patient was discharged in stable condition.  Patient's presentation is most consistent with acute complicated illness / injury requiring diagnostic workup.    FINAL CLINICAL IMPRESSION(S) / ED DIAGNOSES   Final diagnoses:  Hypertension, unspecified type     Rx / DC Orders   ED Discharge Orders          Ordered    amLODipine (NORVASC) 5 MG tablet  Daily        08/15/22 1544             Note:  This document was prepared using Dragon voice recognition software and may include unintentional dictation errors.   Jackelyn Hoehnoggi, Teshawn Moan E, PA-C 08/15/22 1620    Merwyn KatosBradler, Evan K, MD 08/15/22 [phone]

## 2022-08-15 NOTE — ED Triage Notes (Signed)
Pt to ED for HTN, POV. Alert, oriented. Pt had HA last night and took own BP and it was 110/101. Took tylenol and benadryl, went to sleep. This AM BP was 179/91. States was told before by PCP that BP was high, never was put on BP med, diet controlled. States had bad reaction to cholesterol med, which caused recurrent bladder infections and has been on abx for "1 year".

## 2022-08-15 NOTE — Discharge Instructions (Signed)
Remember to check your blood pressure when you are relaxed and sitting down for at least 30 minutes.  If your blood pressure remains elevated, then you may take the blood pressure medicine as we discussed.  If your blood pressure is not elevated, then please do not take the medication.  Please follow-up as soon as you can with your primary care provider for continued blood pressure management.  Please return the emergency department for any new, worsening, or change in symptoms or other concerns.  Was a pleasure caring for you today.

## 2022-08-18 ENCOUNTER — Telehealth: Payer: Self-pay

## 2022-08-18 NOTE — Telephone Encounter (Signed)
     Patient  visit on 08/15/2022  at [facility] was for hypertension.  Have you been able to follow up with your primary care physician? Patient has an upcoming appointment.  The patient was or was not able to obtain any needed medicine or equipment. Patient is able to obtain medication.  Are there diet recommendations that you are having difficulty following? No  Patient expresses understanding of discharge instructions and education provided has no other needs at this time. Yes   April Russell [name]  [facility] Population Health Community Resource Care Guide   [email or web address]  ?? [phone]   [email or web address]: [email or web address]  [email or web address]

## 2022-08-23 ENCOUNTER — Other Ambulatory Visit: Payer: Self-pay | Admitting: *Deleted

## 2022-08-23 MED ORDER — CEPHALEXIN 250 MG PO CAPS: 250.0000 mg | ORAL_CAPSULE | Freq: Every day | ORAL | 0 refills | Status: DC

## 2022-08-31 DIAGNOSIS — H524 Presbyopia: Secondary | ICD-10-CM | POA: Diagnosis not present

## 2022-09-08 ENCOUNTER — Encounter: Payer: Self-pay | Admitting: Internal Medicine

## 2022-09-16 DIAGNOSIS — H524 Presbyopia: Secondary | ICD-10-CM | POA: Diagnosis not present

## 2022-10-27 ENCOUNTER — Other Ambulatory Visit: Payer: Self-pay | Admitting: Internal Medicine

## 2022-10-27 DIAGNOSIS — Z1329 Encounter for screening for other suspected endocrine disorder: Secondary | ICD-10-CM | POA: Diagnosis not present

## 2022-10-27 DIAGNOSIS — Z1159 Encounter for screening for other viral diseases: Secondary | ICD-10-CM | POA: Diagnosis not present

## 2022-10-27 DIAGNOSIS — Z131 Encounter for screening for diabetes mellitus: Secondary | ICD-10-CM | POA: Diagnosis not present

## 2022-10-27 DIAGNOSIS — Z1231 Encounter for screening mammogram for malignant neoplasm of breast: Secondary | ICD-10-CM | POA: Diagnosis not present

## 2022-10-27 DIAGNOSIS — Z130 Encounter for screening for diseases of the blood and blood-forming organs and certain disorders involving the immune mechanism: Secondary | ICD-10-CM | POA: Diagnosis not present

## 2022-10-27 DIAGNOSIS — Z1211 Encounter for screening for malignant neoplasm of colon: Secondary | ICD-10-CM | POA: Diagnosis not present

## 2022-10-27 DIAGNOSIS — Z1212 Encounter for screening for malignant neoplasm of rectum: Secondary | ICD-10-CM | POA: Diagnosis not present

## 2022-10-27 DIAGNOSIS — E7800 Pure hypercholesterolemia, unspecified: Secondary | ICD-10-CM | POA: Diagnosis not present

## 2022-10-27 DIAGNOSIS — I10 Essential (primary) hypertension: Secondary | ICD-10-CM | POA: Diagnosis not present

## 2022-11-01 DIAGNOSIS — Z1211 Encounter for screening for malignant neoplasm of colon: Secondary | ICD-10-CM | POA: Diagnosis not present

## 2022-11-05 LAB — EXTERNAL GENERIC LAB PROCEDURE: COLOGUARD: NEGATIVE

## 2022-11-05 LAB — COLOGUARD: COLOGUARD: NEGATIVE

## 2022-12-08 ENCOUNTER — Ambulatory Visit
Admission: RE | Admit: 2022-12-08 | Discharge: 2022-12-08 | Disposition: A | Discharge status: Home / Self Care | Payer: Medicare HMO | Source: Ambulatory Visit | Attending: Internal Medicine | Admitting: Internal Medicine

## 2022-12-08 DIAGNOSIS — Z1231 Encounter for screening mammogram for malignant neoplasm of breast: Secondary | ICD-10-CM | POA: Diagnosis not present

## 2022-12-13 ENCOUNTER — Telehealth: Payer: Self-pay | Admitting: Urology

## 2022-12-13 NOTE — Telephone Encounter (Signed)
Patient called request refill for Cephalexin 250 mg capsule. Pharmacy is [facility] on [address]

## 2022-12-15 MED ORDER — CEPHALEXIN 250 MG PO CAPS: 250.0000 mg | ORAL_CAPSULE | Freq: Every day | ORAL | 0 refills | Status: DC

## 2022-12-15 NOTE — Telephone Encounter (Signed)
rx sent to pharmacy by e-script  

## 2023-01-31 ENCOUNTER — Ambulatory Visit: Payer: Medicare HMO | Admitting: Urology

## 2023-01-31 VITALS — BP 135/70 | HR 60 | Ht 63.5 in | Wt 135.0 lb

## 2023-01-31 DIAGNOSIS — N281 Cyst of kidney, acquired: Secondary | ICD-10-CM

## 2023-01-31 DIAGNOSIS — Z87440 Personal history of urinary (tract) infections: Secondary | ICD-10-CM

## 2023-01-31 DIAGNOSIS — R109 Unspecified abdominal pain: Secondary | ICD-10-CM

## 2023-01-31 DIAGNOSIS — R350 Frequency of micturition: Secondary | ICD-10-CM | POA: Diagnosis not present

## 2023-01-31 DIAGNOSIS — N3020 Other chronic cystitis without hematuria: Secondary | ICD-10-CM

## 2023-01-31 LAB — URINALYSIS, COMPLETE
Bilirubin, UA: NEGATIVE
Glucose, UA: NEGATIVE
Ketones, UA: NEGATIVE
Leukocytes,UA: NEGATIVE
Nitrite, UA: NEGATIVE
Protein,UA: NEGATIVE
RBC, UA: NEGATIVE
Specific Gravity, UA: 1.015 (ref 1.005–1.030)
Urobilinogen, Ur: 0.2 mg/dL (ref 0.2–1.0)
pH, UA: 7 (ref 5.0–7.5)

## 2023-01-31 LAB — MICROSCOPIC EXAMINATION: Bacteria, UA: NONE SEEN

## 2023-01-31 MED ORDER — CEPHALEXIN 250 MG PO CAPS: 250.0000 mg | ORAL_CAPSULE | Freq: Every day | ORAL | 3 refills | Status: DC

## 2023-01-31 NOTE — Progress Notes (Signed)
01/31/2023 9:10 AM   April Russell 04-06-1946 [id number]  Referring provider: Reubin Milan, MD [address] [place],  [place] [address]  Chief Complaint  Patient presents with   Urinary Frequency    HPI: Since January patient has been having likely urinary tract infections.  She was having discomfort in the suprapubic area back pain and frequency.  She took half a prescription due to nausea and followed up with 2 more prescriptions eventually recently in the emergency room.  She still has a suprapubic aching.  Her frequency has improved back to baseline every 2 hours.  She normally gets up sometimes once a night  She may have had prolapse surgery before.  She has had a hysterectomy.  At baseline she is continent   Patient had CT scan August 07, 2021.  She had normal kidneys except a small indeterminate right renal lesion incompletely characterized without contrast.  It was an exophytic nodule projecting posteriorly.  She has some gas in the bladder.  Renal ultrasound was recommended.     Pathophysiology of recurrent bladder infections discussed.  Urine sent for culture but looked normal today.  Hopefully her discomfort will down regulate on Keflex 250 mg daily 30x11.  It appears she may have had nausea from the sulfa drug and Macrodantin.  Her last urine culture was positive and one of the recent culture was negative.  Role of probiotics discussed   Ultrasound normal.  She had a small cyst in the right kidney lower pole 1 cm in size corresponding to the CT finding . last culture negative.   Well supported bladder neck and no prolapse with mild atrophy  Cystoscopy normal   Patient has intermittent suprapubic cramps.  She says they are better on Keflex but still come and go.  She understands they may down regulate more if they are due to past history of UTIs.  Otherwise she has been cleared from a genitourinary cause for abdominal pain.  She has had objective urinary  tract infections and I will reassess in 4 months on Keflex.  Urine was normal today   Today Frequency stable stable.  Once again she gets some nonspecific twinges at times which I think are unrelated.  Clinically not infected.  Keflex 250 mg daily 90x3 sent to pharmacy.  Call if culture positive.  See in 1 year  Today She went off the medicine and symptoms came back.  Clinically not infected today.  Wants to stay on medication.  Frequency stable     PMH: Past Medical History:  Diagnosis Date   Hyperlipidemia    Urinary tract infection    recurrent UTIs since 2023    Surgical History: Past Surgical History:  Procedure Laterality Date   ABDOMINAL HYSTERECTOMY  1980   colon reconstruction  1980   RECONSTRUCTION URETHROPLASTY  1980    Home Medications:  Allergies as of 01/31/2023       Reactions   Macrobid [nitrofurantoin] Nausea Only   Atorvastatin    Ceftin [cefuroxime]    Sulfa Antibiotics Hives        Medication List        Accurate as of January 31, 2023  9:10 AM. If you have any questions, ask your nurse or doctor.          acetaminophen 500 MG tablet Commonly known as: TYLENOL Take 500 mg by mouth every 6 (six) hours as needed.   amLODipine 5 MG tablet Commonly known as: NORVASC Take 1  tablet (5 mg total) by mouth daily.   BIOTIN 5000 PO Take by mouth.   cephALEXin 250 MG capsule Commonly known as: Keflex Take 1 capsule (250 mg total) by mouth daily.   COLLAGEN PO Take by mouth 3 (three) times a week.   folic acid 1 MG tablet Commonly known as: FOLVITE Take 1 mg by mouth daily.   multivitamin tablet Take 1 tablet by mouth daily.   PROBIOTIC-10 PO Take by mouth as needed.   VITAMIN B-12 PO Take by mouth daily.   Vitamin D3 125 MCG (5000 UT) Caps Take by mouth.        Allergies:  Allergies  Allergen Reactions   Macrobid [Nitrofurantoin] Nausea Only   Atorvastatin    Ceftin [Cefuroxime]    Sulfa Antibiotics Hives     Family History: Family History  Problem Relation Age of Onset   Hypertension Mother    Cancer Father    Breast cancer Neg Hx     Social History:  reports that she has never smoked. She has never been exposed to tobacco smoke. She has never used smokeless tobacco. She reports current alcohol use of about 4.0 standard drinks of alcohol per week. She reports that she does not use drugs.  ROS:                                        Physical Exam: BP 135/70   Pulse 60   Ht 5' 3.5" (1.613 m)   Wt 61.2 kg   BMI 23.54 kg/m   Constitutional:  Alert and oriented, No acute distress. HEENT: [place] AT, moist mucus membranes.  Trachea midline, no masses.  Laboratory Data: Lab Results  Component Value Date   WBC 3.2 (L) 08/15/2022   HGB 13.7 08/15/2022   HCT 40.6 08/15/2022   MCV 89.2 08/15/2022   PLT 206 08/15/2022    Lab Results  Component Value Date   CREATININE 0.85 08/15/2022    No results found for: "PSA"  No results found for: "TESTOSTERONE"  No results found for: "HGBA1C"  Urinalysis    Component Value Date/Time   COLORURINE YELLOW (A) 08/07/2021 0731   APPEARANCEUR Clear 02/01/2022 1042   LABSPEC 1.008 08/07/2021 0731   PHURINE 6.0 08/07/2021 0731   GLUCOSEU Negative 02/01/2022 1042   HGBUR MODERATE (A) 08/07/2021 0731   BILIRUBINUR Negative 02/01/2022 1042   KETONESUR 5 (A) 08/07/2021 0731   PROTEINUR Negative 02/01/2022 1042   PROTEINUR NEGATIVE 08/07/2021 0731   UROBILINOGEN 0.2 07/28/2021 1338   NITRITE Negative 02/01/2022 1042   NITRITE NEGATIVE 08/07/2021 0731   LEUKOCYTESUR Negative 02/01/2022 1042   LEUKOCYTESUR LARGE (A) 08/07/2021 0731    Pertinent Imaging:   Assessment & Plan: Urine sent for culture.  Keflex 250 mg daily 90 x 3 sent to pharmacy.  I will call the culture positive and I will see in 1 year  1. Urine frequency  - Urinalysis, Complete   No follow-ups on file.  Martina Sinner, MD  [facility] [address] [place], [place] [address] [phone]

## 2023-02-03 LAB — CULTURE, URINE COMPREHENSIVE

## 2023-02-28 DIAGNOSIS — E7800 Pure hypercholesterolemia, unspecified: Secondary | ICD-10-CM | POA: Diagnosis not present

## 2023-02-28 DIAGNOSIS — I10 Essential (primary) hypertension: Secondary | ICD-10-CM | POA: Diagnosis not present

## 2023-02-28 DIAGNOSIS — Z0000 Encounter for general adult medical examination without abnormal findings: Secondary | ICD-10-CM | POA: Diagnosis not present

## 2023-02-28 DIAGNOSIS — N390 Urinary tract infection, site not specified: Secondary | ICD-10-CM | POA: Diagnosis not present

## 2023-03-18 IMAGING — MG MM DIGITAL SCREENING BILAT W/ TOMO AND CAD
8 series · 9 of 24 positions shown · non-contrast
Comparison: Previous exam(s).

CLINICAL DATA: Screening.

EXAM:
DIGITAL SCREENING BILATERAL MAMMOGRAM WITH TOMOSYNTHESIS AND CAD
TECHNIQUE: Bilateral screening digital craniocaudal and mediolateral oblique
mammograms were obtained. Bilateral screening digital breast
tomosynthesis was performed. The images were evaluated with
computer-aided detection.

[R MLO synth-2D]
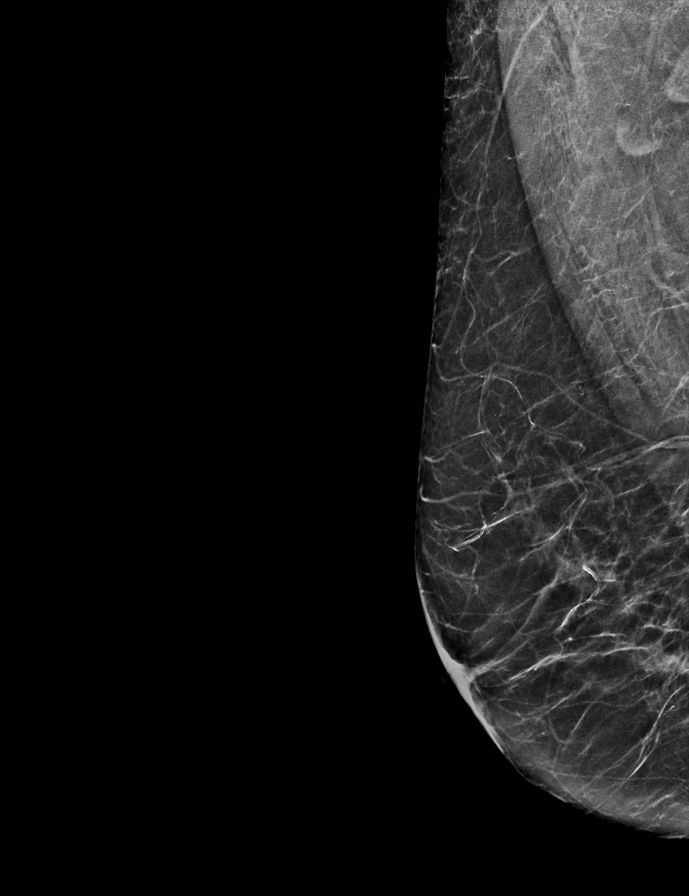

[R CC synth-2D]
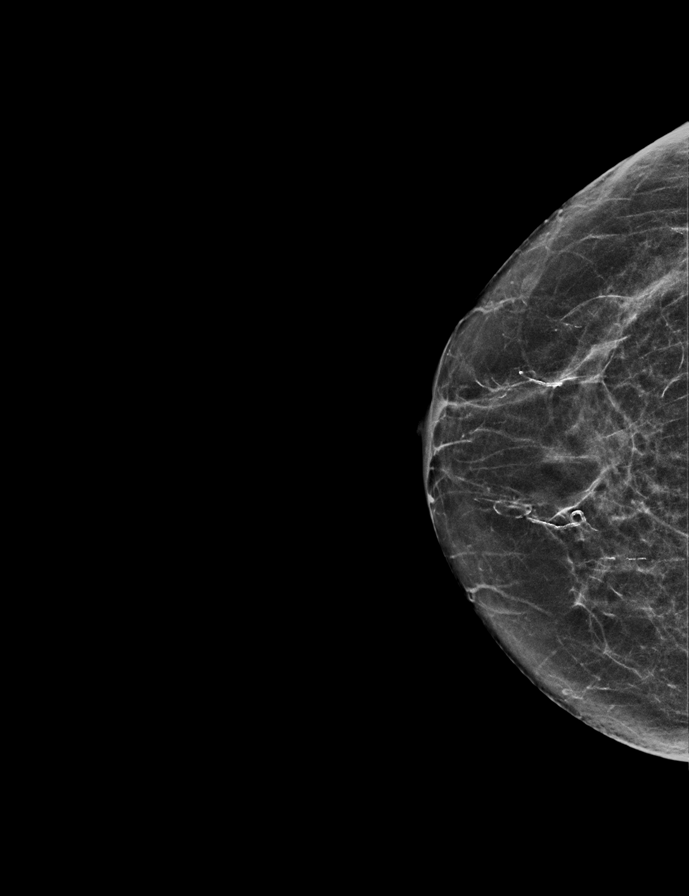

[L MLO synth-2D]
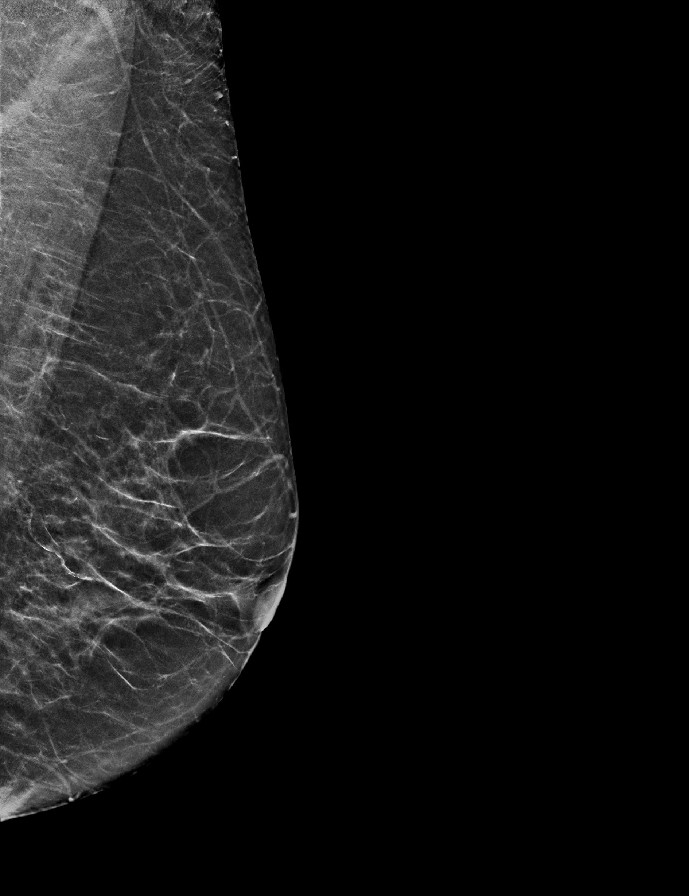

[L CC synth-2D]
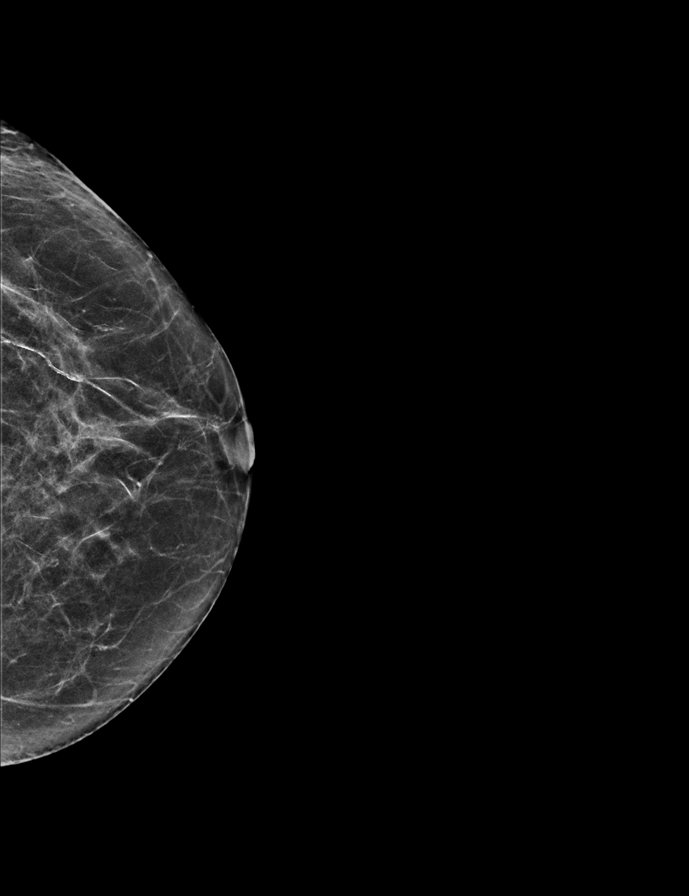

[L CC tomo · 2 of 47 frames shown]
[frame 16/47]
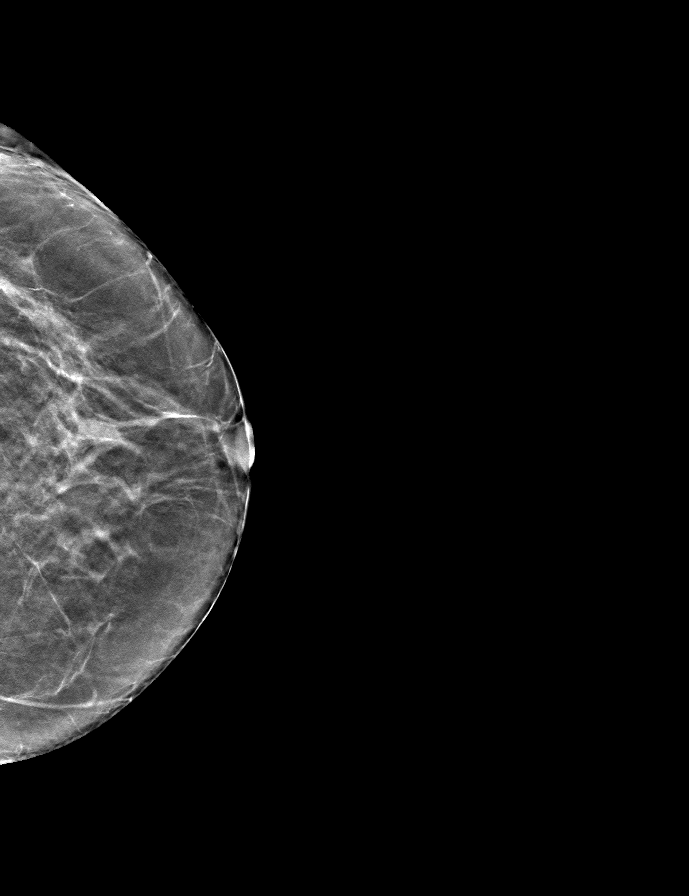
[frame 24/47]
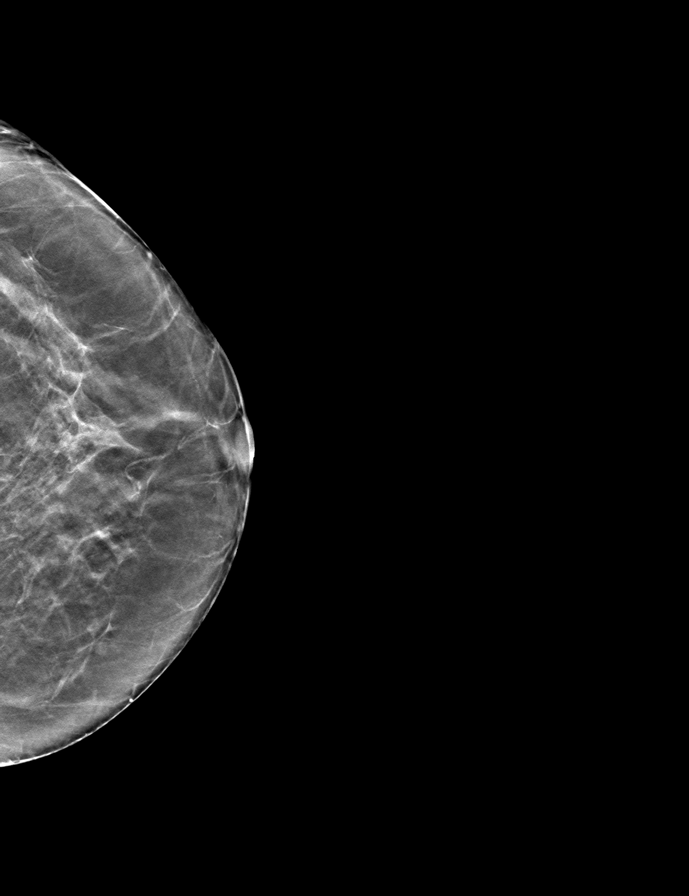

[L MLO tomo · tomo slice 24/47.0]
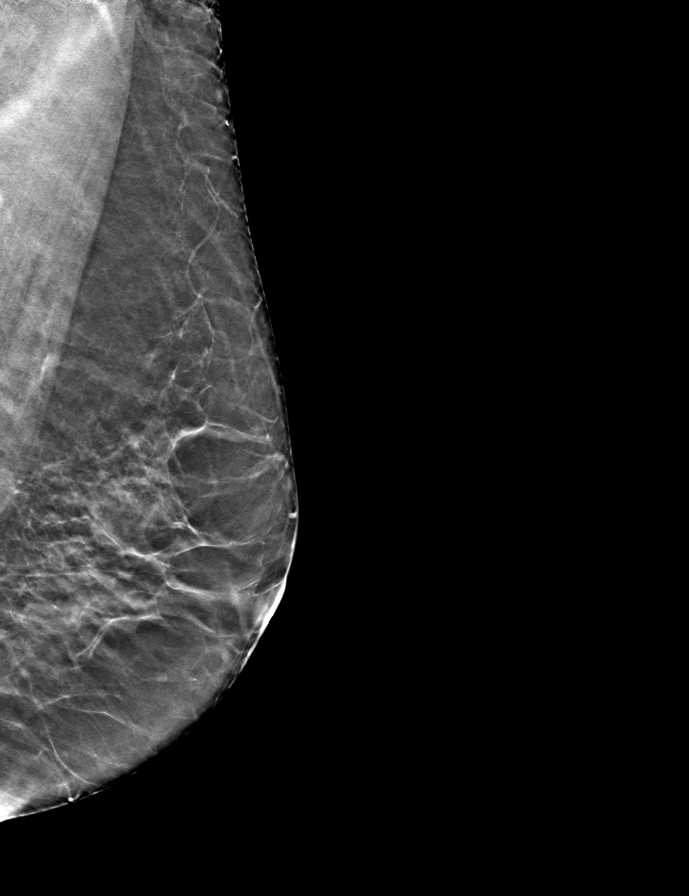

[R CC tomo · tomo slice 25/49.0]
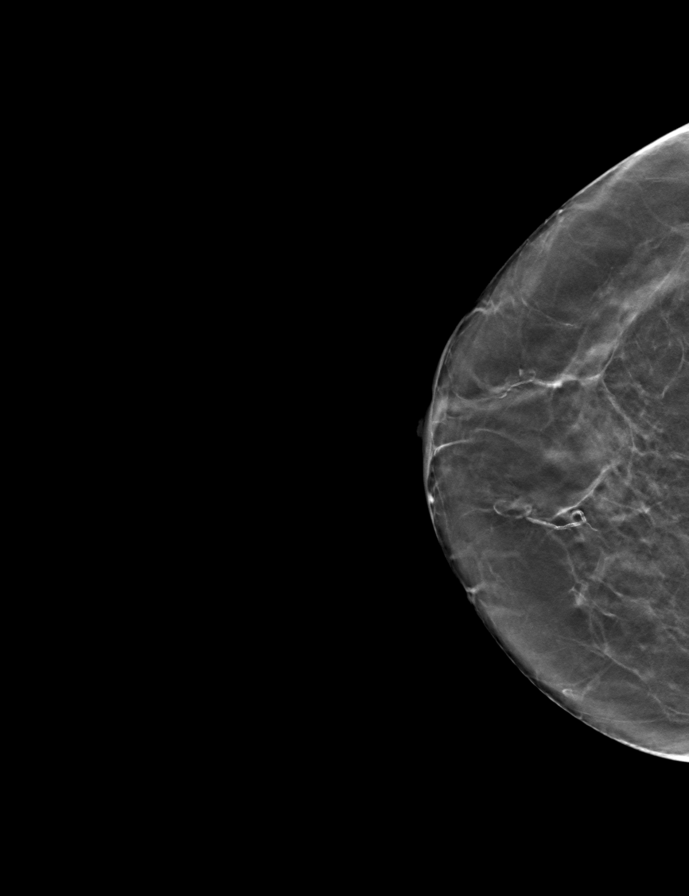

[R MLO tomo · tomo slice 25/50.0]
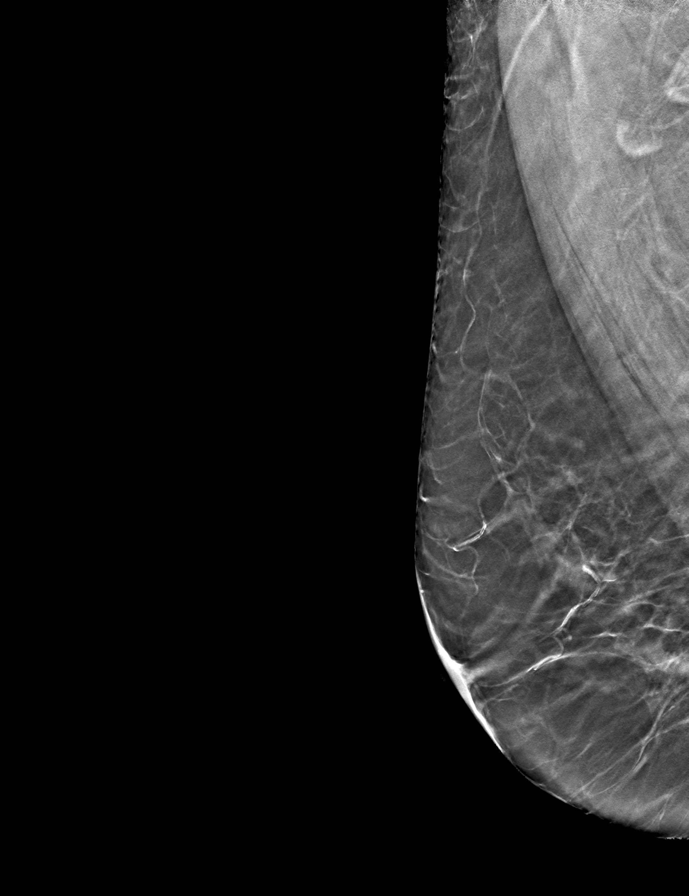

[9 of 24 positions shown; findings below may reference images not displayed]

ACR Breast Density Category b: There are scattered areas of
fibroglandular density.
FINDINGS: There are no findings suspicious for malignancy.
IMPRESSION: No mammographic evidence of malignancy. A result letter of this
screening mammogram will be mailed directly to the patient.

RECOMMENDATION:
Screening mammogram in one year. (Code:51-O-LD2)

BI-RADS CATEGORY  1: Negative.

## 2023-04-03 IMAGING — CT CT RENAL STONE PROTOCOL
2 of 4 series · 15 of 46 positions shown, 17 images · non-contrast
Comparison: None.

CLINICAL DATA: Possible urinary tract infection with back pain and
little relief from antibiotics.



[Series 2: stone full standard · axial · 0.75mm/px · z∈[-892,-487]mm · 12 of 89 slices shown, 14 images]
[im 4/89  soft-tissue]
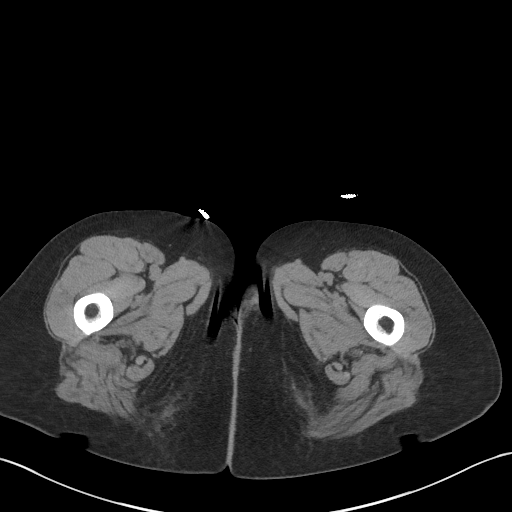
[im 4/89  bone]
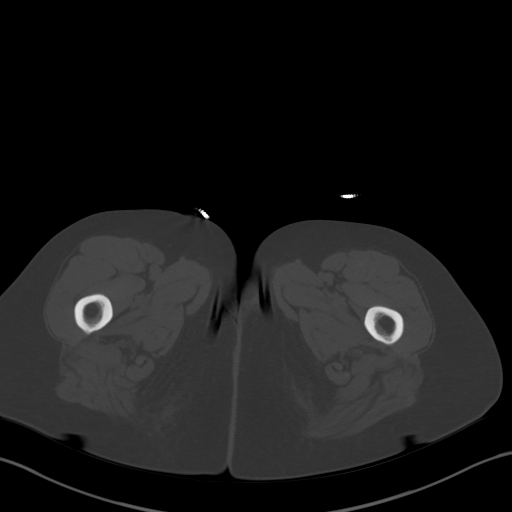
[im 12/89  soft-tissue]
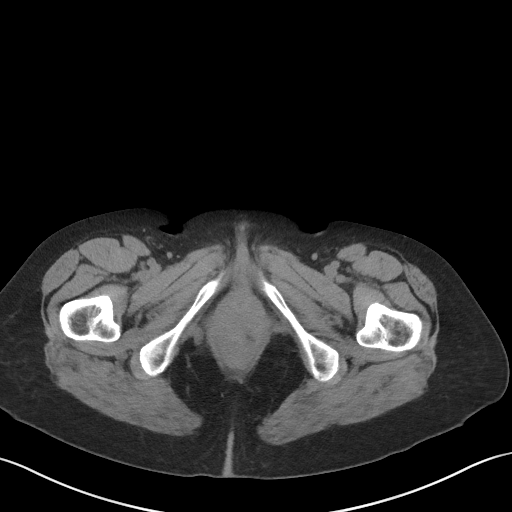
[im 19/89  soft-tissue]
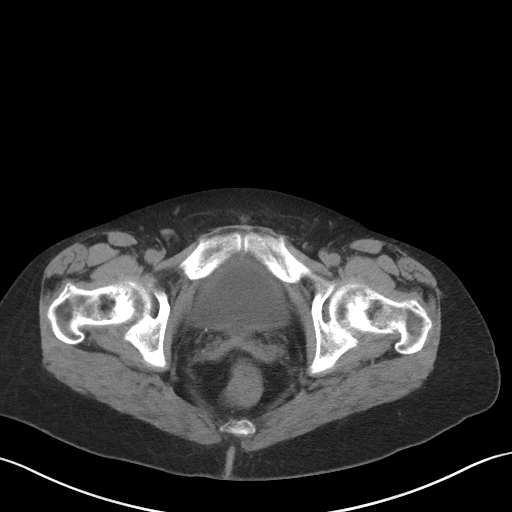
[im 26/89  soft-tissue]
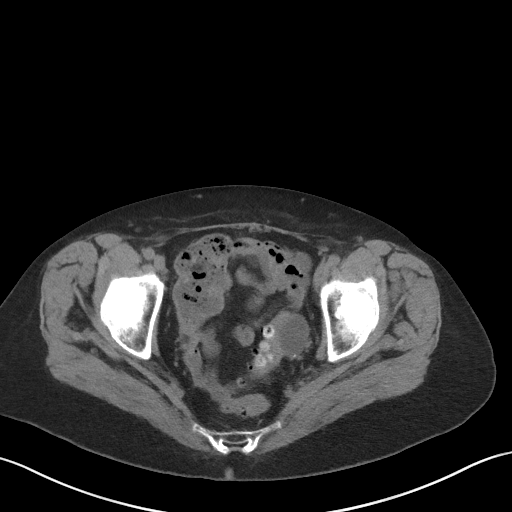
[im 34/89  soft-tissue]
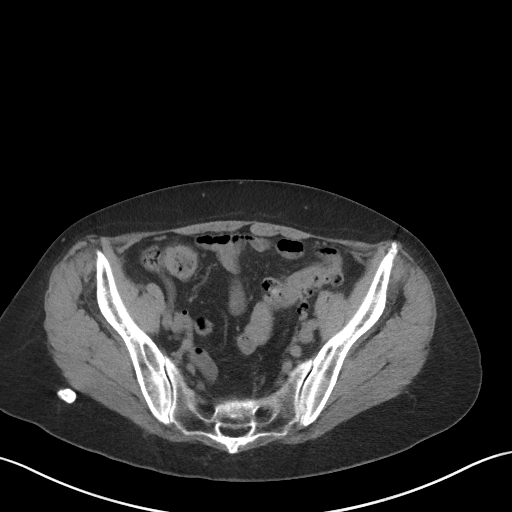
[im 41/89  soft-tissue]
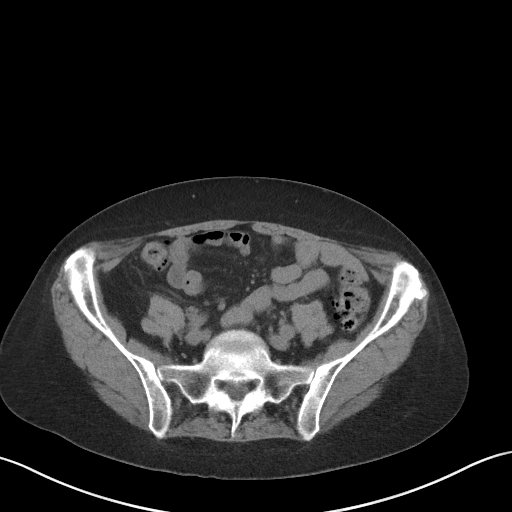
[im 48/89  soft-tissue]
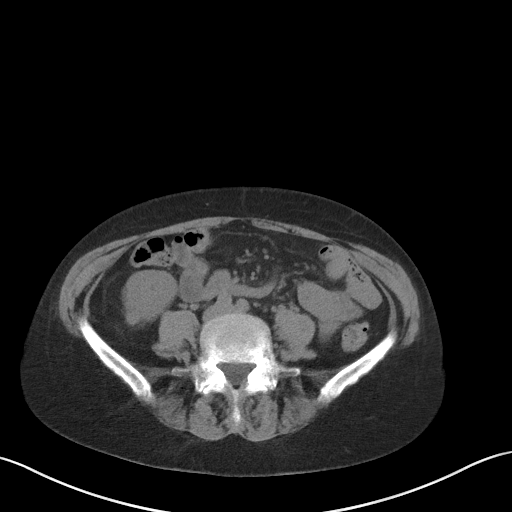
[im 56/89  soft-tissue]
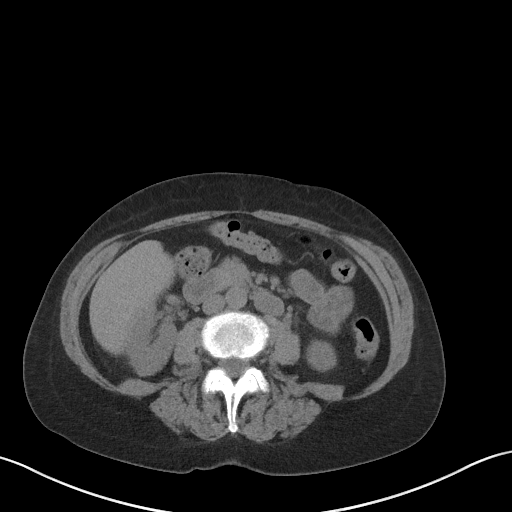
[im 63/89  soft-tissue]
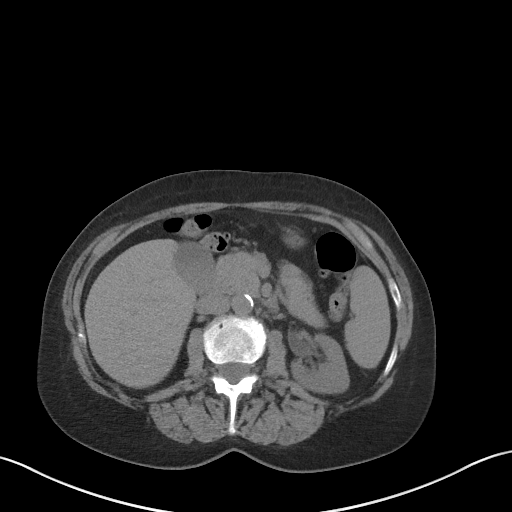
[im 63/89  bone]
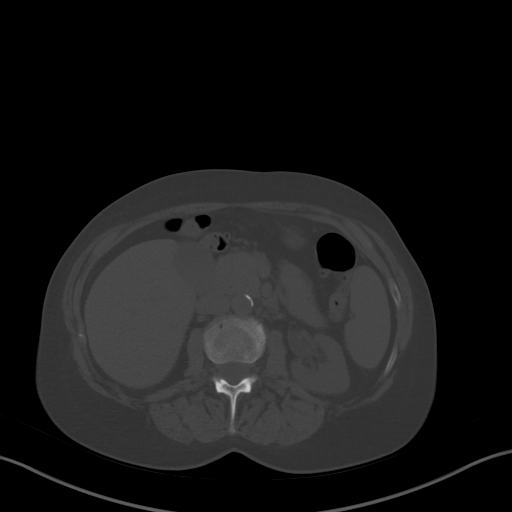
[im 70/89  soft-tissue]
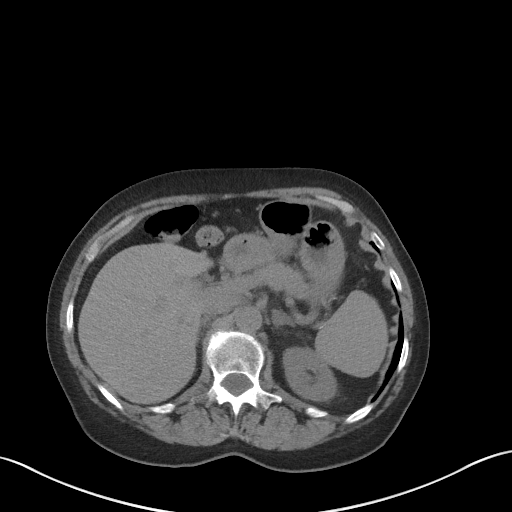
[im 78/89  soft-tissue]
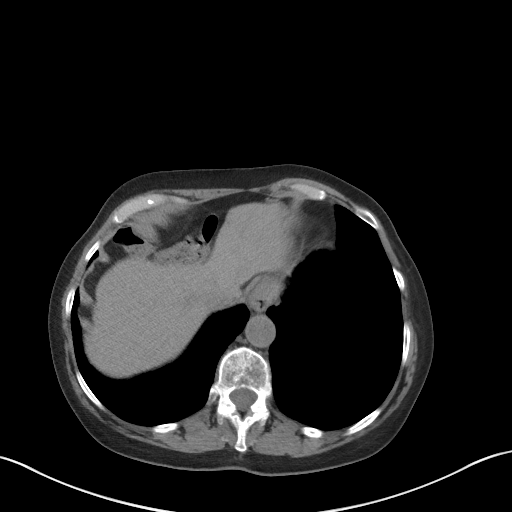
[im 85/89  soft-tissue]
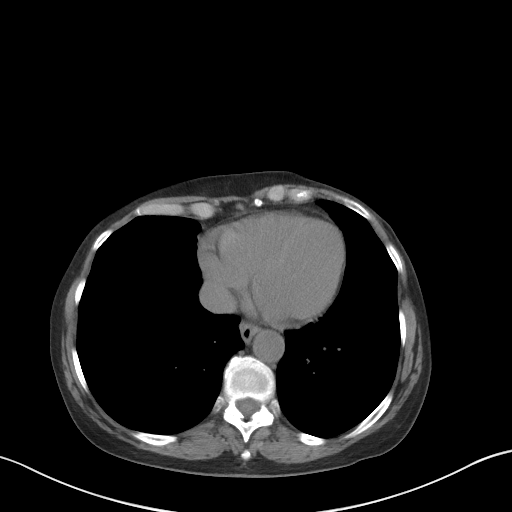

[Series 5: coronal · coronal · 0.67mm/px · 3 of 121 slices shown]
[im 41/121  soft-tissue]
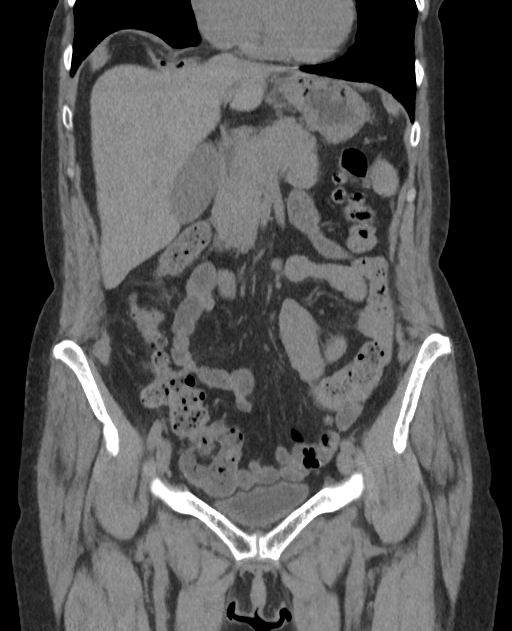
[im 54/121  soft-tissue]
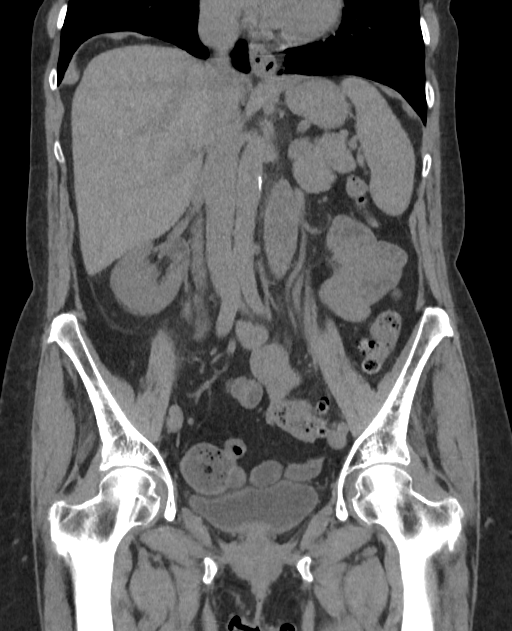
[im 67/121  soft-tissue]
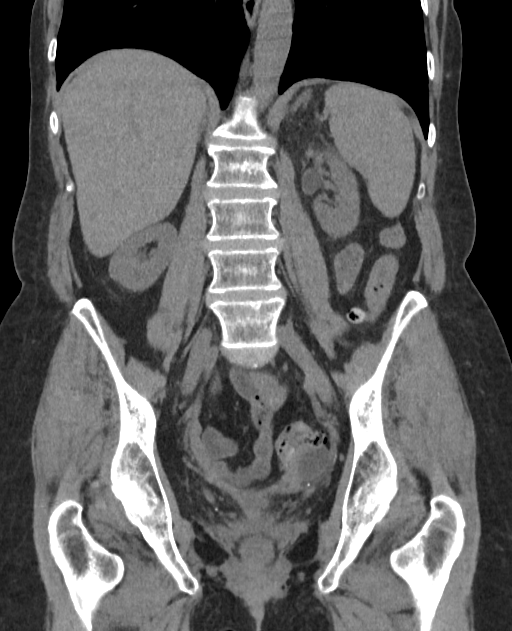

[15 of 46 positions shown; findings below may reference images not displayed]

FINDINGS: Lower chest: Clear lung bases. No significant pleural or pericardial
effusion. Small hiatal hernia.

Hepatobiliary: The liver appears unremarkable as imaged in the
noncontrast state. No evidence of gallstones, gallbladder wall
thickening or biliary dilatation.

Pancreas: Unremarkable. No pancreatic ductal dilatation or
surrounding inflammatory changes.

Spleen: Normal in size without focal abnormality.

Adrenals/Urinary Tract: Both adrenal glands appear normal. No
evidence of urinary tract calculus, hydronephrosis or perinephric
soft tissue stranding. There is a small exophytic nodule projecting
posteriorly from the lower interpolar region of the right kidney,
measuring 8 mm and 33 HU on sagittal image 61/6. There is a small
amount of gas anteriorly in the bladder, possibly iatrogenic. No
evidence of bladder wall thickening or surrounding inflammation.

Stomach/Bowel: No enteric contrast administered. As above, small
hiatal hernia. The stomach otherwise appears unremarkable for its
degree of distention. There are diverticular changes throughout the
descending and sigmoid colon, but no bowel wall thickening or
surrounding inflammation.

Vascular/Lymphatic: There are no enlarged abdominal or pelvic lymph
nodes. Mild aortic and branch vessel atherosclerosis.

Reproductive: Hysterectomy. There is a well-circumscribed
low-density structure in the left adnexa lying lateral to the
sigmoid colon, measuring 3.1 x 2.1 cm on image 64/2. This has a
density of -2 HU and is attributed to a cystic left ovarian lesion
based on relationship with the gonadal vessels. There are no
surrounding inflammatory changes.

Other: No evidence of abdominal wall mass or hernia. No ascites.

Musculoskeletal: No acute or significant osseous findings.
IMPRESSION: 1. No evidence of urinary tract calculus, hydronephrosis or
perinephric soft tissue stranding. Small amount of gas within the
bladder lumen, likely iatrogenic.
2. Small indeterminate right renal lesion, incompletely
characterized without contrast. Consider renal ultrasound for
further evaluation.
3. 3.1 cm left ovarian simple-appearing cyst, not adequately
characterized. Recommend prompt follow-up with pelvic US.
Reference: JACR [DATE]):[id number]
4. Distal colonic diverticulosis without acute inflammation.
5.  Aortic Atherosclerosis ([id number]).

## 2024-01-23 ENCOUNTER — Ambulatory Visit (INDEPENDENT_AMBULATORY_CARE_PROVIDER_SITE_OTHER): Payer: Self-pay | Admitting: Urology

## 2024-01-23 VITALS — BP 137/74 | HR 60 | Ht 63.0 in | Wt 135.0 lb

## 2024-01-23 DIAGNOSIS — N3020 Other chronic cystitis without hematuria: Secondary | ICD-10-CM | POA: Diagnosis not present

## 2024-01-23 DIAGNOSIS — R350 Frequency of micturition: Secondary | ICD-10-CM | POA: Diagnosis not present

## 2024-01-23 LAB — URINALYSIS, COMPLETE
Bilirubin, UA: NEGATIVE
Glucose, UA: NEGATIVE
Ketones, UA: NEGATIVE
Leukocytes,UA: NEGATIVE
Nitrite, UA: NEGATIVE
Protein,UA: NEGATIVE
RBC, UA: NEGATIVE
Specific Gravity, UA: <1.005 — ABNORMAL LOW (ref 1.005–1.030)
Urobilinogen, Ur: 0.2 mg/dL (ref 0.2–1.0)
pH, UA: 6 (ref 5.0–7.5)

## 2024-01-23 LAB — MICROSCOPIC EXAMINATION: RBC, Urine: NONE SEEN /HPF (ref 0–2)

## 2024-01-23 MED ORDER — CEPHALEXIN 250 MG PO CAPS
250.0000 mg | ORAL_CAPSULE | Freq: Every day | ORAL | 3 refills | Status: AC
Start: 2024-01-23 — End: ?

## 2024-01-23 NOTE — Progress Notes (Signed)
 01/23/2024 8:56 AM   Heron Jerry October 27, 1945 [id number]  Referring provider: No referring provider defined for this encounter.  Chief Complaint  Patient presents with   Follow-up   Urinary Frequency    HPI: Since January patient has been having likely urinary tract infections.  She was having discomfort in the suprapubic area back pain and frequency.  She took half a prescription due to nausea and followed up with 2 more prescriptions eventually recently in the emergency room.  She still has a suprapubic aching.  Her frequency has improved back to baseline every 2 hours.  She normally gets up sometimes once a night  She may have had prolapse surgery before.  She has had a hysterectomy.  At baseline she is continent   Patient had CT scan August 07, 2021.  She had normal kidneys except a small indeterminate right renal lesion incompletely characterized without contrast.  It was an exophytic nodule projecting posteriorly.  She has some gas in the bladder.  Renal ultrasound was recommended.     Pathophysiology of recurrent bladder infections discussed.  Urine sent for culture but looked normal today.  Hopefully her discomfort will down regulate on Keflex  250 mg daily 30x11.  It appears she may have had nausea from the sulfa drug and Macrodantin.  Her last urine culture was positive and one of the recent culture was negative.  Role of probiotics discussed   Ultrasound normal.  She had a small cyst in the right kidney lower pole 1 cm in size corresponding to the CT finding . last culture negative.   Well supported bladder neck and no prolapse with mild atrophy  Cystoscopy normal   Patient has intermittent suprapubic cramps.  She says they are better on Keflex  but still come and go.  She understands they may down regulate more if they are due to past history of UTIs.  Otherwise she has been cleared from a genitourinary cause for abdominal pain.  She has had objective urinary tract  infections and I will reassess in 4 months on Keflex .  Urine was normal today   Today Frequency stable stable.  Once again she gets some nonspecific twinges at times which I think are unrelated.  Clinically not infected.  Keflex  250 mg daily 90x3 sent to pharmacy.  Call if culture positive.  See in 1 year   Today She went off the medicine and symptoms came back.  Clinically not infected today.  Wants to stay on medication.  Frequency stable  Since January patient has been having likely urinary tract infections.  She was having discomfort in the suprapubic area back pain and frequency.  She took half a prescription due to nausea and followed up with 2 more prescriptions eventually recently in the emergency room.  She still has a suprapubic aching.  Her frequency has improved back to baseline every 2 hours.  She normally gets up sometimes once a night  She may have had prolapse surgery before.  She has had a hysterectomy.  At baseline she is continent   Patient had CT scan August 07, 2021.  She had normal kidneys except a small indeterminate right renal lesion incompletely characterized without contrast.  It was an exophytic nodule projecting posteriorly.  She has some gas in the bladder.  Renal ultrasound was recommended.     Pathophysiology of recurrent bladder infections discussed.  Urine sent for culture but looked normal today.  Hopefully her discomfort will down regulate on Keflex  250 mg daily 30x11.  It appears she may have had nausea from the sulfa drug and Macrodantin.  Her last urine culture was positive and one of the recent culture was negative.  Role of probiotics discussed   Ultrasound normal.  She had a small cyst in the right kidney lower pole 1 cm in size corresponding to the CT finding . last culture negative.   Well supported bladder neck and no prolapse with mild atrophy  Cystoscopy normal   Patient has intermittent suprapubic cramps.  She says they are better on Keflex  but  still come and go.  She understands they may down regulate more if they are due to past history of UTIs.  Otherwise she has been cleared from a genitourinary cause for abdominal pain.  She has had objective urinary tract infections and I will reassess in 4 months on Keflex .  Urine was normal today    Today Frequency stable.  No infections.  Sometimes when she stops the medication she starts to ache.  Call if culture positive.  See in 1 year  PMH: Past Medical History:  Diagnosis Date   Hyperlipidemia    Urinary tract infection    recurrent UTIs since 2023    Surgical History: Past Surgical History:  Procedure Laterality Date   ABDOMINAL HYSTERECTOMY  1980   colon reconstruction  1980   RECONSTRUCTION URETHROPLASTY  1980    Home Medications:  Allergies as of 01/23/2024       Reactions   Macrobid [nitrofurantoin] Nausea Only   Atorvastatin     Ceftin  [cefuroxime ]    Sulfa Antibiotics Hives        Medication List        Accurate as of January 23, 2024  8:56 AM. If you have any questions, ask your nurse or doctor.          acetaminophen 500 MG tablet Commonly known as: TYLENOL Take 500 mg by mouth every 6 (six) hours as needed.   amLODipine  5 MG tablet Commonly known as: NORVASC  Take 1 tablet (5 mg total) by mouth daily.   BIOTIN 5000 PO Take by mouth.   cephALEXin  250 MG capsule Commonly known as: Keflex  Take 1 capsule (250 mg total) by mouth daily.   COLLAGEN PO Take by mouth 3 (three) times a week.   folic acid 1 MG tablet Commonly known as: FOLVITE Take 1 mg by mouth daily.   multivitamin tablet Take 1 tablet by mouth daily.   One Daily Calcium /Iron Tabs Take 1 tablet by mouth daily.   PROBIOTIC-10 PO Take by mouth as needed.   VITAMIN B-12 PO Take by mouth daily.   Vitamin D3 125 MCG (5000 UT) Caps Take by mouth.        Allergies:  Allergies  Allergen Reactions   Macrobid [Nitrofurantoin] Nausea Only   Atorvastatin     Ceftin   [Cefuroxime ]    Sulfa Antibiotics Hives    Family History: Family History  Problem Relation Age of Onset   Hypertension Mother    Cancer Father    Breast cancer Neg Hx     Social History:  reports that she has never smoked. She has never been exposed to tobacco smoke. She has never used smokeless tobacco. She reports current alcohol use of about 4.0 standard drinks of alcohol per week. She reports that she does not use drugs.  ROS:  Physical Exam: There were no vitals taken for this visit.  Constitutional:  Alert and oriented, No acute distress. HEENT: [place] AT, moist mucus membranes.  Trachea midline, no masses.   Laboratory Data: Lab Results  Component Value Date   WBC 3.2 (L) 08/15/2022   HGB 13.7 08/15/2022   HCT 40.6 08/15/2022   MCV 89.2 08/15/2022   PLT 206 08/15/2022    Lab Results  Component Value Date   CREATININE 0.85 08/15/2022    No results found for: PSA  No results found for: TESTOSTERONE  No results found for: HGBA1C  Urinalysis    Component Value Date/Time   COLORURINE YELLOW (A) 08/07/2021 0731   APPEARANCEUR Clear 01/31/2023 0902   LABSPEC 1.008 08/07/2021 0731   PHURINE 6.0 08/07/2021 0731   GLUCOSEU Negative 01/31/2023 0902   HGBUR MODERATE (A) 08/07/2021 0731   BILIRUBINUR Negative 01/31/2023 0902   KETONESUR 5 (A) 08/07/2021 0731   PROTEINUR Negative 01/31/2023 0902   PROTEINUR NEGATIVE 08/07/2021 0731   UROBILINOGEN 0.2 07/28/2021 1338   NITRITE Negative 01/31/2023 0902   NITRITE NEGATIVE 08/07/2021 0731   LEUKOCYTESUR Negative 01/31/2023 0902   LEUKOCYTESUR LARGE (A) 08/07/2021 0731    Pertinent Imaging: Urine reviewed and sent for culture  Assessment & Plan: Keflex  250 mg once a day 90 x 3 sent to pharmacy and I will see 1 year  1. Urine frequency (Primary)  - Urinalysis, Complete  2. Chronic cystitis  - Urinalysis, Complete   No follow-ups on  file.  Glendia DELENA Elizabeth, MD  [facility] [address], [address] [place], [place] [address] [phone]

## 2024-01-24 ENCOUNTER — Other Ambulatory Visit: Payer: Self-pay | Admitting: Internal Medicine

## 2024-01-24 DIAGNOSIS — Z1231 Encounter for screening mammogram for malignant neoplasm of breast: Secondary | ICD-10-CM

## 2024-01-26 LAB — CULTURE, URINE COMPREHENSIVE

## 2024-03-05 ENCOUNTER — Ambulatory Visit
Admission: RE | Admit: 2024-03-05 | Discharge: 2024-03-05 | Disposition: A | Discharge status: Home / Self Care | Source: Ambulatory Visit | Attending: Internal Medicine | Admitting: Internal Medicine

## 2024-03-05 DIAGNOSIS — Z1231 Encounter for screening mammogram for malignant neoplasm of breast: Secondary | ICD-10-CM | POA: Insufficient documentation

## 2025-01-21 ENCOUNTER — Ambulatory Visit: Admitting: Urology
